# Patient Record
Sex: Male | Born: 1980 | Race: White | Hispanic: No | Smoking: Former smoker
Health system: Southern US, Community
[De-identification: ages and names within clinical notes are randomized; demographics above are authoritative.]

## PROBLEM LIST (undated history)

## (undated) DIAGNOSIS — I10 Essential (primary) hypertension: Secondary | ICD-10-CM

## (undated) DIAGNOSIS — E785 Hyperlipidemia, unspecified: Secondary | ICD-10-CM

## (undated) DIAGNOSIS — N529 Male erectile dysfunction, unspecified: Secondary | ICD-10-CM

## (undated) DIAGNOSIS — F419 Anxiety disorder, unspecified: Secondary | ICD-10-CM

## (undated) DIAGNOSIS — F32A Depression, unspecified: Secondary | ICD-10-CM

## (undated) HISTORY — DX: Hyperlipidemia, unspecified: E78.5

## (undated) HISTORY — DX: Essential (primary) hypertension: I10

## (undated) HISTORY — PX: BLEPHAROPLASTY: SUR158

---

## 2021-01-05 ENCOUNTER — Emergency Department (HOSPITAL_COMMUNITY): Payer: No Typology Code available for payment source

## 2021-01-05 ENCOUNTER — Encounter (HOSPITAL_COMMUNITY): Payer: Self-pay | Admitting: Emergency Medicine

## 2021-01-05 DIAGNOSIS — R0789 Other chest pain: Secondary | ICD-10-CM | POA: Diagnosis present

## 2021-01-05 DIAGNOSIS — Z7982 Long term (current) use of aspirin: Secondary | ICD-10-CM | POA: Diagnosis not present

## 2021-01-05 LAB — CBC
HCT: 43.7 % (ref 39.0–52.0)
Hemoglobin: 15.4 g/dL (ref 13.0–17.0)
MCH: 36.9 pg — ABNORMAL HIGH (ref 26.0–34.0)
MCHC: 35.2 g/dL (ref 30.0–36.0)
MCV: 104.8 fL — ABNORMAL HIGH (ref 80.0–100.0)
Platelets: 190 10*3/uL (ref 150–400)
RBC: 4.17 MIL/uL — ABNORMAL LOW (ref 4.22–5.81)
RDW: 11 % — ABNORMAL LOW (ref 11.5–15.5)
WBC: 8.8 10*3/uL (ref 4.0–10.5)
nRBC: 0 % (ref 0.0–0.2)

## 2021-01-05 LAB — BASIC METABOLIC PANEL
Anion gap: 10 (ref 5–15)
BUN: 10 mg/dL (ref 6–20)
CO2: 22 mmol/L (ref 22–32)
Calcium: 9.1 mg/dL (ref 8.9–10.3)
Chloride: 101 mmol/L (ref 98–111)
Creatinine, Ser: 0.71 mg/dL (ref 0.61–1.24)
GFR, Estimated: >60 mL/min (ref >60)
Glucose, Bld: 95 mg/dL (ref 70–99)
Potassium: 3.9 mmol/L (ref 3.5–5.1)
Sodium: 133 mmol/L — ABNORMAL LOW (ref 135–145)

## 2021-01-05 LAB — ETHANOL: Alcohol, Ethyl (B): <10 mg/dL (ref <10)

## 2021-01-05 LAB — TROPONIN I (HIGH SENSITIVITY): Troponin I (High Sensitivity): <2 ng/L (ref <18)

## 2021-01-05 NOTE — ED Triage Notes (Signed)
Patient reports burning in chest and tingling in bilateral arms with SOB since 1600. Reports three other similar episodes this week.

## 2021-01-05 NOTE — ED Triage Notes (Signed)
Emergency Medicine Provider Triage Evaluation Note  Devin Hendricks , a 40 y.o. male  was evaluated in triage.  Pt complains of midsternal chest pain.  Patient reports that his pain began at 1700 while working out at the pain.  Patient describes his pain is a burning.  No radiation of pain.  Patient also complains of burning sensation to his as well as numbness and tingling to his extremities.  Patient reports that he was a every day drinker for last 2 to 3 months however has not had any alcohol since Monday.  Review of Systems  Positive: Chest pain, shortness of breath, back pain, numbness and tingling to extremities, anxiety Negative: Nausea, vomiting, abdominal pain  Physical Exam  BP 140/89   Pulse (!) 106   Temp 97.8 F (36.6 C)   Resp 20   SpO2 97%  Gen:   Awake, no distress   HEENT:  Atraumatic  Resp:  Normal effort, lungs clear to auscultation bilaterally Cardiac:  Tachy at rate of 106, no murmurs rubs or gallops MSK:   Moves extremities without difficulty Neuro:  Speech clear   Medical Decision Making  Medically screening exam initiated at 10:09 PM.  Appropriate orders placed.  Devin Hendricks was informed that the remainder of the evaluation will be completed by another provider, this initial triage assessment does not replace that evaluation, and the importance of remaining in the ED until their evaluation is complete.  Clinical Impression   The patient appears stable so that the remainder of the work up may be completed by another provider.     Haskel Schroeder, [place] 01/05/21 2211

## 2021-01-06 ENCOUNTER — Emergency Department (HOSPITAL_COMMUNITY)
Admission: EM | Admit: 2021-01-06 | Discharge: 2021-01-06 | Disposition: A | Discharge status: Home / Self Care | Payer: No Typology Code available for payment source | Attending: Emergency Medicine | Admitting: Emergency Medicine

## 2021-01-06 DIAGNOSIS — R079 Chest pain, unspecified: Secondary | ICD-10-CM

## 2021-01-06 HISTORY — DX: Anxiety disorder, unspecified: F41.9

## 2021-01-06 HISTORY — DX: Depression, unspecified: F32.A

## 2021-01-06 HISTORY — DX: Male erectile dysfunction, unspecified: N52.9

## 2021-01-06 LAB — TROPONIN I (HIGH SENSITIVITY): Troponin I (High Sensitivity): <2 ng/L (ref <18)

## 2021-01-06 MED ORDER — LIDOCAINE VISCOUS HCL 2 % MT SOLN
15.0000 mL | Freq: Once | OROMUCOSAL | Status: AC
  Administered 2021-01-06: 15 mL via ORAL
  Filled 2021-01-06: qty 15

## 2021-01-06 MED ORDER — ALUM & MAG HYDROXIDE-SIMETH 200-200-20 MG/5ML PO SUSP
30.0000 mL | Freq: Once | ORAL | Status: AC
  Administered 2021-01-06: 30 mL via ORAL
  Filled 2021-01-06: qty 30

## 2021-01-06 NOTE — ED Provider Notes (Signed)
[facility]-EMERGENCY DEPT Provider Note   CSN: [id number] Arrival date & time: 01/05/21  2152     History Chief Complaint  Patient presents with  . Chest Pain    Devin Hendricks is a 40 y.o. male.  40 year old male presents to the emergency department for evaluation of midsternal, burning chest pain.  Symptoms began while he was working out at [employer]; however, he has had recurrence of similar pain over the past week 4 or 5 other times.  It has not been brought on by exertion before.  Pain typically subsides spontaneously in 4 to 5 hours.  He does feel increasingly anxious when he has the pain.  Has occasionally experienced associated paresthesias in his hands and bilateral feet.  Denies taking any medications for his symptoms.  Does endorse history of heavy drinking.  Prior to 1 week ago he was drinking a 12 pack of beer a day.  Denies tremors or seizures from alcohol withdrawal.  No nausea, vomiting, diaphoresis, abdominal pain, fevers.  The history is provided by the patient. No language interpreter was used.  Chest Pain      Past Medical History:  Diagnosis Date  . Anxiety   . Depression   . Erectile dysfunction     There are no problems to display for this patient.   History reviewed. No pertinent surgical history.     No family history on file.     Home Medications Prior to Admission medications   Medication Sig Start Date End Date Taking? Authorizing Provider  aspirin 325 MG tablet Take 325 mg by mouth daily as needed for mild pain, moderate pain or headache.   Yes [provider]  buPROPion (WELLBUTRIN XL) 300 MG 24 hr tablet Take 300 mg by mouth daily. 04/05/19  Yes [provider]  tadalafil (CIALIS) 5 MG tablet Take 5 mg by mouth daily. For BPH 04/05/19  Yes [provider]  traZODone (DESYREL) 150 MG tablet Take 150 mg by mouth at bedtime. 04/05/19  Yes [provider]    Allergies    Patient has  no known allergies.  Review of Systems   Review of Systems  Cardiovascular: Positive for chest pain.  Ten systems reviewed and are negative for acute change, except as noted in the HPI.    Physical Exam Updated Vital Signs BP 125/80 (BP Location: Right Arm)   Pulse 78   Temp 98 F (36.7 C) (Oral)   Resp 16   Ht 5\' 10"  (1.778 m)   Wt 73 kg   SpO2 99%   BMI 23.10 kg/m   Physical Exam Vitals and nursing note reviewed.  Constitutional:      General: He is not in acute distress.    Appearance: He is well-developed. He is not diaphoretic.     Comments: Nontoxic appearing and in NAD  HENT:     Head: Normocephalic and atraumatic.  Eyes:     General: No scleral icterus.    Conjunctiva/sclera: Conjunctivae normal.  Pulmonary:     Effort: Pulmonary effort is normal. No respiratory distress.     Comments: Respirations even and unlabored Musculoskeletal:        General: Normal range of motion.     Cervical back: Normal range of motion.  Skin:    General: Skin is warm and dry.     Coloration: Skin is not pale.     Findings: No erythema or rash.  Neurological:     Mental Status:  He is alert and oriented to person, place, and time.  Psychiatric:        Behavior: Behavior normal.     ED Results / Procedures / Treatments   Labs (all labs ordered are listed, but only abnormal results are displayed) Labs Reviewed  BASIC METABOLIC PANEL - Abnormal; Notable for the following components:      Result Value   Sodium 133 (*)    All other components within normal limits  CBC - Abnormal; Notable for the following components:   RBC 4.17 (*)    MCV 104.8 (*)    MCH 36.9 (*)    RDW 11.0 (*)    All other components within normal limits  ETHANOL  TROPONIN I (HIGH SENSITIVITY)  TROPONIN I (HIGH SENSITIVITY)    EKG None  Radiology DG Chest 2 View  Result Date: 01/05/2021 CLINICAL DATA:  Chest pain EXAM: CHEST - 2 VIEW COMPARISON:  None. FINDINGS: The heart size and mediastinal  contours are within normal limits. Both lungs are clear. The visualized skeletal structures are unremarkable. IMPRESSION: No active cardiopulmonary disease. Electronically Signed   By: Deatra Robinson M.D.   On: 01/05/2021 22:28    Procedures Procedures   Medications Ordered in ED Medications  alum & mag hydroxide-simeth (MAALOX/MYLANTA) 200-200-20 MG/5ML suspension 30 mL (30 mLs Oral Given 01/06/21 0211)    And  lidocaine (XYLOCAINE) 2 % viscous mouth solution 15 mL (15 mLs Oral Given 01/06/21 0211)    ED Course  I have reviewed the triage vital signs and the nursing notes.  Pertinent labs & imaging results that were available during my care of the patient were reviewed by me and considered in my medical decision making (see chart for details).    MDM Rules/Calculators/A&P                          40 year old male presents to the emergency department for evaluation of chest pain.  He has had approximately 4 episodes of similar burning midsternal discomfort in the past week.  Unknown provoking or alleviating factors, but usually subsides spontaneously in 4 to 5 hours.  His symptoms are atypical for ACS.  EKG today is nonischemic.  He has a negative troponin x2.  Chest x-ray without acute cardiopulmonary abnormality.  Specifically, no cardiomegaly, pneumothorax, pleural effusion, pneumonia.  He does have a longstanding history of alcohol use and abuse.  Discussed the possibility of underlying gastritis, PUD; however, he had little change in his residual pain with a GI cocktail given today.  Uses alcohol as a way of self-medicating and managing his anxiety.  His paresthesias do sound fairly consistent with symptoms of a panic attack; anxiety may also be provoking his chest pain.    Doubt emergent etiology.  Feel the patient is stable for discharge and outpatient cardiology follow-up.  Will provide referral.  He has been encouraged to refrain from alcohol consumption.  Return precautions discussed  and provided. Patient discharged in stable condition with no unaddressed concerns.   Final Clinical Impression(s) / ED Diagnoses Final diagnoses:  Nonspecific chest pain    Rx / DC Orders ED Discharge Orders    None       Antony Madura, PA-C 01/06/21 0109    Marily Memos, MD 01/06/21 [phone]

## 2021-01-06 NOTE — Discharge Instructions (Signed)
Your evaluation in the emergency department today was reassuring.  We recommend follow-up with cardiology for further evaluation of your chest pain symptoms as well as a primary care doctor.  Try to limit your alcohol consumption.  Return for new or concerning symptoms.

## 2021-02-07 ENCOUNTER — Ambulatory Visit: Payer: No Typology Code available for payment source | Admitting: Interventional Cardiology

## 2021-02-14 ENCOUNTER — Ambulatory Visit: Payer: Self-pay | Admitting: Interventional Cardiology

## 2021-03-13 ENCOUNTER — Emergency Department (HOSPITAL_COMMUNITY)
Admission: EM | Admit: 2021-03-13 | Discharge: 2021-03-14 | Disposition: A | Discharge status: Home / Self Care | Payer: PRIVATE HEALTH INSURANCE | Attending: Emergency Medicine | Admitting: Emergency Medicine

## 2021-03-13 ENCOUNTER — Emergency Department (HOSPITAL_COMMUNITY): Payer: PRIVATE HEALTH INSURANCE

## 2021-03-13 DIAGNOSIS — R200 Anesthesia of skin: Secondary | ICD-10-CM | POA: Diagnosis not present

## 2021-03-13 DIAGNOSIS — R079 Chest pain, unspecified: Secondary | ICD-10-CM | POA: Diagnosis not present

## 2021-03-13 DIAGNOSIS — H538 Other visual disturbances: Secondary | ICD-10-CM | POA: Diagnosis not present

## 2021-03-13 DIAGNOSIS — R0602 Shortness of breath: Secondary | ICD-10-CM | POA: Diagnosis not present

## 2021-03-13 DIAGNOSIS — Z5321 Procedure and treatment not carried out due to patient leaving prior to being seen by health care provider: Secondary | ICD-10-CM | POA: Insufficient documentation

## 2021-03-13 DIAGNOSIS — M79602 Pain in left arm: Secondary | ICD-10-CM | POA: Diagnosis not present

## 2021-03-13 LAB — BASIC METABOLIC PANEL
Anion gap: 9 (ref 5–15)
BUN: 8 mg/dL (ref 6–20)
CO2: 24 mmol/L (ref 22–32)
Calcium: 9 mg/dL (ref 8.9–10.3)
Chloride: 87 mmol/L — ABNORMAL LOW (ref 98–111)
Creatinine, Ser: 0.63 mg/dL (ref 0.61–1.24)
GFR, Estimated: >60 mL/min (ref >60)
Glucose, Bld: 106 mg/dL — ABNORMAL HIGH (ref 70–99)
Potassium: 4.9 mmol/L (ref 3.5–5.1)
Sodium: 120 mmol/L — ABNORMAL LOW (ref 135–145)

## 2021-03-13 LAB — CBC
HCT: 36.8 % — ABNORMAL LOW (ref 39.0–52.0)
Hemoglobin: 13.5 g/dL (ref 13.0–17.0)
MCH: 33.6 pg (ref 26.0–34.0)
MCHC: 36.7 g/dL — ABNORMAL HIGH (ref 30.0–36.0)
MCV: 91.5 fL (ref 80.0–100.0)
Platelets: 280 10*3/uL (ref 150–400)
RBC: 4.02 MIL/uL — ABNORMAL LOW (ref 4.22–5.81)
RDW: 12.2 % (ref 11.5–15.5)
WBC: 7.3 10*3/uL (ref 4.0–10.5)
nRBC: 0 % (ref 0.0–0.2)

## 2021-03-13 LAB — TROPONIN I (HIGH SENSITIVITY)
Troponin I (High Sensitivity): <2 ng/L (ref <18)
Troponin I (High Sensitivity): <2 ng/L (ref <18)

## 2021-03-13 NOTE — ED Triage Notes (Signed)
Pt c/o worsening CP/SHOB, L arm pain, blurred vision, numbness in extremities. Denies hx, but states he "doesn't feel like his brain is getting enough oxygen." States he was seen at hospital for similar occurrence, was told to follow up w cardiologist- appt 7/14. Was seen by GP today & was told EKG abnormal & was told to come to ED.

## 2021-03-14 NOTE — ED Notes (Signed)
Pt called for vitals, no response. 

## 2021-03-17 ENCOUNTER — Encounter: Payer: Self-pay | Admitting: Cardiology

## 2021-03-17 ENCOUNTER — Ambulatory Visit: Payer: PRIVATE HEALTH INSURANCE | Admitting: Cardiology

## 2021-03-17 ENCOUNTER — Other Ambulatory Visit: Payer: Self-pay

## 2021-03-17 VITALS — BP 112/69 | HR 78 | Temp 98.0°F | Resp 16 | Ht 70.0 in | Wt 160.0 lb

## 2021-03-17 DIAGNOSIS — R072 Precordial pain: Secondary | ICD-10-CM

## 2021-03-17 DIAGNOSIS — R002 Palpitations: Secondary | ICD-10-CM | POA: Insufficient documentation

## 2021-03-17 DIAGNOSIS — Z8249 Family history of ischemic heart disease and other diseases of the circulatory system: Secondary | ICD-10-CM | POA: Insufficient documentation

## 2021-03-17 NOTE — Progress Notes (Signed)
Patient referred by Devin Rosser, PA-C for chest pain  Subjective:   Devin Hendricks, male    DOB: 03-08-1981, 40 y.o.   MRN: [id number]   Chief Complaint  Patient presents with   Chest Pain     HPI  40 y.o. Caucasian male with prior history of tobacco dependence, now with chest pain, paresthesias, palpitations.  Patient stays active at baseline, works out 30 minutes on elliptical 5 days a week.  He is self-employed.  For the last several weeks, he has had episodes of retrosternal and left-sided chest tightness lasting for several hours.  He also has paresthesias in his left arm and both legs.  He feels as if " I do not get enough blood supply and oxygen to my brain", and reports forgetfulness episodes.  Patient has had multiple recent ER/urgent care evaluations recently, all ruled out ACS.  Past Medical History:  Diagnosis Date   Anxiety    Depression    Erectile dysfunction      History reviewed. No pertinent surgical history.   Social History   Tobacco Use  Smoking Status Former   Packs/day: 1.00   Years: 20.00   Pack years: 20.00   Types: Cigarettes   Quit date: 2020   Years since quitting: 2.4  Smokeless Tobacco Never    Social History   Substance and Sexual Activity  Alcohol Use Not Currently     Family History  Problem Relation Age of Onset   Heart disease Mother    Diabetes Mother      Current Outpatient Medications on File Prior to Visit  Medication Sig Dispense Refill   aspirin 325 MG tablet Take 325 mg by mouth daily as needed for mild pain, moderate pain or headache.     sildenafil (REVATIO) 20 MG tablet Take 20 mg by mouth 3 (three) times daily.     valsartan (DIOVAN) 320 MG tablet Take 320 mg by mouth daily.     No current facility-administered medications on file prior to visit.    Cardiovascular and other pertinent studies:  EKG 03/17/2021: Sinus rhythm 76 bpm Possible old anteroseptal infarct  Chest Xray 03/13/2021: No  active cardiopulmonary disease.    Recent labs: 03/13/2021: Glucose 106, BUN/Cr 8/0.63. EGFR >60. Na/K 120/4.9.  Trop HS <2 H/H 13/36. MCV 81. Platelets 280 HbA1C N/A Lipid panel N/A TSH N/A  Results for Devin, Hendricks (MRN [id number]) as of 03/17/2021 15:14  Ref. Range 01/05/2021 22:17 03/13/2021 17:23  Sodium Latest Ref Range: 135 - 145 mmol/L 133 (L) 120 (L)    Review of Systems  Cardiovascular:  Positive for chest pain and palpitations. Negative for dyspnea on exertion, leg swelling and syncope.  Neurological:  Positive for paresthesias.  Psychiatric/Behavioral:  The patient is nervous/anxious.         Vitals:   03/17/21 1514  BP: 112/69  Pulse: 78  Resp: 16  Temp: 98 F (36.7 C)  SpO2: 100%     Body mass index is 22.96 kg/m. Filed Weights   03/17/21 1514  Weight: 160 lb (72.6 kg)     Objective:   Physical Exam Vitals and nursing note reviewed.  Constitutional:      General: He is not in acute distress. Neck:     Vascular: No JVD.  Cardiovascular:     Rate and Rhythm: Normal rate and regular rhythm.     Pulses: Normal pulses.     Heart sounds: Normal heart sounds. No murmur heard. Pulmonary:  Effort: Pulmonary effort is normal.     Breath sounds: Normal breath sounds. No wheezing or rales.  Musculoskeletal:     Right lower leg: No edema.     Left lower leg: No edema.        Assessment & Recommendations:    40 y.o. Caucasian male with prior history of tobacco dependence, now with chest pain, paresthesias, palpitations.  Chest pain, palpitations: Atypical chest pain.  Family history of CAD, and personal history of prior tobacco dependence. Recommend exercise nuclear stress test, echocardiogram, CT cardiac scoring and cardiac telemetry.  Rest of his symptoms are unlikely cardiac in origin.  Recommend checking with PCP.  Hyponatremia: Noticed on recent blood work.  Etiology unclear.  Differ work-up and follow-up to PCP.  Further  recommendations after above testing.     Thank you for referring the patient to [place]. Please feel free to contact with any questions.   Nigel Mormon, MD Pager: [phone] Office: [phone]

## 2021-04-09 ENCOUNTER — Ambulatory Visit: Payer: No Typology Code available for payment source

## 2021-04-09 ENCOUNTER — Other Ambulatory Visit: Payer: Self-pay

## 2021-04-09 ENCOUNTER — Ambulatory Visit
Admission: RE | Admit: 2021-04-09 | Discharge: 2021-04-09 | Disposition: A | Discharge status: Home / Self Care | Payer: PRIVATE HEALTH INSURANCE | Source: Ambulatory Visit | Attending: Cardiology | Admitting: Cardiology

## 2021-04-09 ENCOUNTER — Inpatient Hospital Stay: Payer: No Typology Code available for payment source

## 2021-04-09 DIAGNOSIS — R002 Palpitations: Secondary | ICD-10-CM

## 2021-04-09 DIAGNOSIS — R072 Precordial pain: Secondary | ICD-10-CM

## 2021-04-09 DIAGNOSIS — Z8249 Family history of ischemic heart disease and other diseases of the circulatory system: Secondary | ICD-10-CM

## 2021-04-11 ENCOUNTER — Other Ambulatory Visit: Payer: Self-pay | Admitting: Cardiology

## 2021-04-11 DIAGNOSIS — R931 Abnormal findings on diagnostic imaging of heart and coronary circulation: Secondary | ICD-10-CM

## 2021-04-11 NOTE — Progress Notes (Signed)
Spoke with patient, transferred call to front desk staff for scheduling.

## 2021-04-15 ENCOUNTER — Ambulatory Visit: Payer: Self-pay | Admitting: Cardiology

## 2021-04-15 NOTE — Progress Notes (Signed)
8/4 is okay

## 2021-04-30 LAB — BASIC METABOLIC PANEL
BUN/Creatinine Ratio: 10 (ref 9–20)
BUN: 6 mg/dL (ref 6–24)
CO2: 23 mmol/L (ref 20–29)
Calcium: 8.8 mg/dL (ref 8.7–10.2)
Chloride: 90 mmol/L — ABNORMAL LOW (ref 96–106)
Creatinine, Ser: 0.63 mg/dL — ABNORMAL LOW (ref 0.76–1.27)
Glucose: 81 mg/dL (ref 65–99)
Potassium: 4.8 mmol/L (ref 3.5–5.2)
Sodium: 125 mmol/L — ABNORMAL LOW (ref 134–144)
eGFR: 122 mL/min/{1.73_m2} (ref >59)

## 2021-04-30 LAB — LIPID PANEL
Chol/HDL Ratio: 2.3 ratio (ref 0.0–5.0)
Cholesterol, Total: 111 mg/dL (ref 100–199)
HDL: 49 mg/dL (ref >39)
LDL Chol Calc (NIH): 50 mg/dL (ref 0–99)
Triglycerides: 52 mg/dL (ref 0–149)
VLDL Cholesterol Cal: 12 mg/dL (ref 5–40)

## 2021-05-01 ENCOUNTER — Other Ambulatory Visit: Payer: Self-pay

## 2021-05-01 ENCOUNTER — Ambulatory Visit: Payer: No Typology Code available for payment source | Admitting: Cardiology

## 2021-05-01 ENCOUNTER — Encounter: Payer: Self-pay | Admitting: Cardiology

## 2021-05-01 VITALS — BP 104/65 | HR 75 | Temp 99.0°F | Resp 16 | Ht 70.0 in | Wt 161.0 lb

## 2021-05-01 DIAGNOSIS — I4729 Other ventricular tachycardia: Secondary | ICD-10-CM | POA: Insufficient documentation

## 2021-05-01 DIAGNOSIS — I2584 Coronary atherosclerosis due to calcified coronary lesion: Secondary | ICD-10-CM | POA: Insufficient documentation

## 2021-05-01 DIAGNOSIS — I471 Supraventricular tachycardia: Secondary | ICD-10-CM | POA: Insufficient documentation

## 2021-05-01 DIAGNOSIS — I472 Ventricular tachycardia: Secondary | ICD-10-CM

## 2021-05-01 DIAGNOSIS — R931 Abnormal findings on diagnostic imaging of heart and coronary circulation: Secondary | ICD-10-CM | POA: Insufficient documentation

## 2021-05-01 DIAGNOSIS — I10 Essential (primary) hypertension: Secondary | ICD-10-CM | POA: Insufficient documentation

## 2021-05-01 MED ORDER — ATORVASTATIN CALCIUM 20 MG PO TABS
20.0000 mg | ORAL_TABLET | Freq: Every day | ORAL | 3 refills | Status: DC
Start: 2021-05-01 — End: 2022-03-06

## 2021-05-01 MED ORDER — LISINOPRIL 20 MG PO TABS: 20.0000 mg | ORAL_TABLET | Freq: Every day | ORAL | 3 refills | Status: DC

## 2021-05-01 MED ORDER — DILTIAZEM HCL ER COATED BEADS 120 MG PO CP24: 120.0000 mg | ORAL_CAPSULE | Freq: Every day | ORAL | 3 refills | Status: DC

## 2021-05-01 MED ORDER — ASPIRIN EC 81 MG PO TBEC: 81.0000 mg | DELAYED_RELEASE_TABLET | Freq: Every day | ORAL | 3 refills | Status: AC

## 2021-05-01 NOTE — Progress Notes (Signed)
Patient referred by Shanon Rosser, PA-C for chest pain  Subjective:   Devin Hendricks, male    DOB: 1981/04/24, 40 y.o.   MRN: [id number]   No chief complaint on file.    HPI  40 y.o. Caucasian male with prior history of tobacco dependence, now with chest pain, paresthesias, palpitations.  Patient continues to have episodes of palpitations.  He is very concerned about his CT scan results.  He is also concerned about his potassium levels.  I discussed the results with him, details below.  Initial consultation HPI 02/2021: Patient stays active at baseline, works out 30 minutes on elliptical 5 days a week.  He is self-employed.  For the last several weeks, he has had episodes of retrosternal and left-sided chest tightness lasting for several hours.  He also has paresthesias in his left arm and both legs.  He feels as if " I do not get enough blood supply and oxygen to my brain", and reports forgetfulness episodes.  Patient has had multiple recent ER/urgent care evaluations recently, all ruled out ACS.     Current Outpatient Medications on File Prior to Visit  Medication Sig Dispense Refill   aspirin 325 MG tablet Take 325 mg by mouth daily as needed for mild pain, moderate pain or headache.     sildenafil (REVATIO) 20 MG tablet Take 20 mg by mouth 3 (three) times daily.     valsartan (DIOVAN) 320 MG tablet Take 320 mg by mouth daily.     No current facility-administered medications on file prior to visit.    Cardiovascular and other pertinent studies:  Mobile cardiac telemetry 13 days 04/09/2021 - 04/23/2021: Dominant rhythm: Sinus. HR 44-157 bpm. Avg HR 75 bpm, in sinus rhythm. 17 episodes of SVT, fastest at 160 bpm for 4 beats, longest for 11 beats at 115 bpm. <1% isolated SVE, couplet/triplets. 1 episode of VT, at 193 bpm for 14 beats <1% isolated VE, no couplet/triplets. No atrial fibrillation/atrial flutter/VT/high grade AV block, sinus pause >3sec noted. 15 patient  triggered events, most correlated with SVE/SVT..   CT cardiac scoring 04/09/2021: LM: 0 LAD: 216 LCx: 5.4 RCA: 125   Total Agatston Score: 346 MESA database percentile: 99  Exercise nuclear stress test 04/09/2021: Normal ECG stress. The patient exercised for 10 minutes and 0 seconds of a Bruce protocol, achieving approximately 11.78 METs.  Normal BP response. No arrhythmias. Stress terminated due to THR achieved (92% of MPHR). Myocardial perfusion is normal. Overall LV systolic function is normal without regional wall motion abnormalities. Stress LV EF: 69%. No previous exam available for comparison. Low risk.  Echocardiogram 04/09/2021:  Left ventricle cavity is normal in size. Mild concentric hypertrophy of  the left ventricle. Normal global wall motion. Normal LV systolic function  with EF 61%. Normal diastolic filling pattern.  Left atrial cavity is mildly dilated.  Mild (Grade I) mitral regurgitation.  No evidence of pulmonary hypertension.  EKG 03/17/2021: Sinus rhythm 76 bpm Possible old anteroseptal infarct  Chest Xray 03/13/2021: No active cardiopulmonary disease.    Recent labs: Lipid panel 04/29/2021: Chol 111, TG 52, HDL 49, LDL 50  03/13/2021: Glucose 106, BUN/Cr 8/0.63. EGFR >60. Na/K 120/4.9.  Trop HS <2 H/H 13/36. MCV 81. Platelets 280 HbA1C N/A TSH N/A  Results for JAMS, TRICKETT (MRN [id number]) as of 03/17/2021 15:14  Ref. Range 01/05/2021 22:17 03/13/2021 17:23  Sodium Latest Ref Range: 135 - 145 mmol/L 133 (L) 120 (L)    Review of Systems  Cardiovascular:  Positive for chest pain and palpitations. Negative for dyspnea on exertion, leg swelling and syncope.  Neurological:  Positive for paresthesias.  Psychiatric/Behavioral:  The patient is nervous/anxious.         Vitals:   05/01/21 1342  BP: 104/65  Pulse: 75  Resp: 16  Temp: 99 F (37.2 C)  SpO2: 98%     Body mass index is 23.1 kg/m. Filed Weights   05/01/21 1342  Weight: 161 lb  (73 kg)     Objective:   Physical Exam Vitals and nursing note reviewed.  Constitutional:      General: He is not in acute distress. Neck:     Vascular: No JVD.  Cardiovascular:     Rate and Rhythm: Normal rate and regular rhythm.     Pulses: Normal pulses.     Heart sounds: Normal heart sounds. No murmur heard. Pulmonary:     Effort: Pulmonary effort is normal.     Breath sounds: Normal breath sounds. No wheezing or rales.  Musculoskeletal:     Right lower leg: No edema.     Left lower leg: No edema.        Assessment & Recommendations:    40 y.o. 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 Recommend diltiazem 120 mg daily.  He would like to avoid beta-blocker due to possibility of sexual dysfunction a side effect.  Elevated coronary calcium score: Total score of  346 (99th percentile) all in non-left main arteries. Recommend aggressive risk factor modification. Lipid panel very favorable, in spite of high coronary calcium score Check lipoprotein a, apolipoprotein B I will start him on atorvastatin 20 mg daily, and repeat lipid panel and lipoprotein a levels in 3 months again.  If lipoprotein a levels are increasing, will strongly consider starting PCSK9 inhibitor Repatha.  Hyponatremia: I recommend hyponatremia is related to valsartan.  Stop valsartan.  Instead, started lisinopril 20 mg daily.  I expect potassium also to be lower with this change.  Will repeat BMP in 1 week.  F/u in 3 months  Time spent: 40-minute   Nigel Mormon, MD Pager: [phone] Office: [phone]

## 2021-08-01 ENCOUNTER — Ambulatory Visit: Payer: No Typology Code available for payment source | Admitting: Cardiology

## 2021-11-19 ENCOUNTER — Other Ambulatory Visit: Payer: Self-pay

## 2021-11-19 ENCOUNTER — Encounter: Payer: Self-pay | Admitting: Family Medicine

## 2021-11-19 ENCOUNTER — Ambulatory Visit (INDEPENDENT_AMBULATORY_CARE_PROVIDER_SITE_OTHER): Payer: 59 | Admitting: Family Medicine

## 2021-11-19 VITALS — BP 110/62 | HR 86 | Temp 98.1°F | Ht 69.0 in | Wt 176.2 lb

## 2021-11-19 DIAGNOSIS — Z0000 Encounter for general adult medical examination without abnormal findings: Secondary | ICD-10-CM | POA: Diagnosis not present

## 2021-11-19 DIAGNOSIS — I10 Essential (primary) hypertension: Secondary | ICD-10-CM

## 2021-11-19 DIAGNOSIS — R931 Abnormal findings on diagnostic imaging of heart and coronary circulation: Secondary | ICD-10-CM

## 2021-11-19 MED ORDER — TADALAFIL 5 MG PO TABS: 5.0000 mg | ORAL_TABLET | Freq: Every day | ORAL | 3 refills | Status: DC | PRN

## 2021-11-19 MED ORDER — EMTRICITABINE-TENOFOVIR DF 200-300 MG PO TABS: 1.0000 | ORAL_TABLET | Freq: Every day | ORAL | 1 refills | Status: DC

## 2021-11-19 NOTE — Progress Notes (Signed)
Phone: [phone]   Subjective:  Patient 41 y.o. male presenting for annual physical.  Chief Complaint  Patient presents with   Establish Care    Need general wellness exam, check lipids, need refill and get back on prep    Cpx-Tdap few yrs ago.  CT score elevated.-exercising. Taking statin SVT-taking diltiazem-bp's 110's/60's. P80-85. No symptoms BPH-on tadalifil-works well.  SAD-wellbutrin will get off for summer.  Trazodone for sleep PrEP-was doing on demand. But needing daily again. Discovy  See problem oriented charting- ROS- full  review of systems was completed and negative  except for: ROS: Gen: no fever, chills  Skin: no rash, itching ENT: no ear pain, ear drainage, nasal congestion, rhinorrhea, sinus pressure, sore throat Eyes: no blurry vision, double vision Resp: no cough, wheeze,SOB Breast: no breast tenderness, no nipple discharge, no breast masses CV: no CP, palpitations, LE edema,  GI: no heartburn, n/v/d/c, abd pain GU: no dysuria, urgency, frequency, hematuria MSK: no joint pain, myalgias, back pain Neuro: no dizziness, headache, weakness, vertigo Psych: no depression, anxiety, insomnia, SI   The following were reviewed and entered/updated in epic: Past Medical History:  Diagnosis Date   Anxiety    Depression    Erectile dysfunction    Hyperlipidemia    Hypertension    Patient Active Problem List   Diagnosis Date Noted   Elevated coronary artery calcium score 05/01/2021   PSVT (paroxysmal supraventricular tachycardia) ([place]) 05/01/2021   NSVT (nonsustained ventricular tachycardia) 05/01/2021   Essential hypertension 05/01/2021   Precordial pain 03/17/2021   Family history of early CAD 03/17/2021   Palpitations 03/17/2021   Past Surgical History:  Procedure Laterality Date   BLEPHAROPLASTY      Family History  Problem Relation Age of Onset   Cancer Mother 3       lymphoma   Hypertension Mother    Hyperlipidemia Mother    Heart  disease Mother    Early death Mother    Depression Mother    Diabetes Mother    Heart attack Mother     Medications- reviewed and updated Current Outpatient Medications  Medication Sig Dispense Refill   aspirin EC 81 MG tablet Take 1 tablet (81 mg total) by mouth daily. Swallow whole. 90 tablet 3   atorvastatin (LIPITOR) 20 MG tablet Take 1 tablet (20 mg total) by mouth daily. 90 tablet 3   buPROPion (WELLBUTRIN XL) 300 MG 24 hr tablet SMARTSIG:1 Tablet(s) By Mouth Every Evening     diltiazem (CARDIZEM CD) 120 MG 24 hr capsule Take 1 capsule (120 mg total) by mouth daily. 90 capsule 3   emtricitabine-tenofovir (TRUVADA) 200-300 MG tablet Take 1 tablet by mouth daily. 90 tablet 1   lisinopril (ZESTRIL) 30 MG tablet Take 30 mg by mouth 2 (two) times daily.     traZODone (DESYREL) 150 MG tablet SMARTSIG:1 Tablet(s) By Mouth Every Evening     tadalafil (CIALIS) 5 MG tablet Take 1 tablet (5 mg total) by mouth daily as needed for erectile dysfunction. 90 tablet 3   No current facility-administered medications for this visit.    Allergies-reviewed and updated No Known Allergies  Social History   Social History Narrative   Children 1 adopted 1 natural      Self-media production   Objective  Objective:  BP 110/62    Pulse 86    Temp 98.1 F (36.7 C) (Temporal)    Ht 5\' 9"  (1.753 m)    Wt 176 lb 4 oz (79.9 kg)  SpO2 97%    BMI 26.03 kg/m  Physical Exam  Gen: WDWN NAD HEENT: NCAT, conjunctiva not injected, sclera nonicteric NECK:  supple, no thyromegaly, no nodes, no carotid bruits CARDIAC: RRR, S1S2+, no murmur. DP 2+B LUNGS: CTAB. No wheezes ABDOMEN:  BS+, soft, NTND, No HSM, no masses EXT:  no edema MSK: no gross abnormalities. MS 5/5 all 4 NEURO: A&O x3.  CN II-XII intact.  PSYCH: normal mood. Good eye contact    Assessment and Plan   Health Maintenance counseling: 1. Anticipatory guidance: Patient counseled regarding regular dental exams q6 months, eye exams yearly,  avoiding smoking and second hand smoke, limiting alcohol to 2 beverages per day.   2. Risk factor reduction:  Advised patient of need for regular exercise and diet rich in fruits and vegetables to reduce risk of heart attack and stroke. Exercise- does.   Wt Readings from Last 3 Encounters:  11/19/21 176 lb 4 oz (79.9 kg)  05/01/21 161 lb (73 kg)  03/17/21 160 lb (72.6 kg)   3. Immunizations/screenings/ancillary studies Immunization History  Administered Date(s) Administered   Influenza,inj,quad, With Preservative 08/08/2018   Influenza-Unspecified 05/29/2021   PFIZER(Purple Top)SARS-COV-2 Vaccination 12/07/2019, 12/28/2019, 08/28/2020, 07/21/2021   Pneumococcal Polysaccharide-23 03/04/2017   Vaccinia,smallpox Monkeypox Vaccine Live,pf 06/15/2021, 07/21/2021   Health Maintenance Due  Topic Date Due   HIV Screening  Never done   Hepatitis C Screening  Never done   TETANUS/TDAP  Never done    4. Prostate cancer screening >55yo - risk factors? No results found for: PSA  5. Colon cancer screening:n/z 6. Skin cancer screening- Iadvised regular sunscreen use. Denies worrisome, changing, or new skin lesions.  7. Smoking associated screening (lung cancer screening, AAA screen 65-75, UA)- non smoker-8. STD screening - hold  Problem List Items Addressed This Visit       Cardiovascular and Mediastinum   Elevated coronary artery calcium score   Relevant Medications   lisinopril (ZESTRIL) 30 MG tablet   tadalafil (CIALIS) 5 MG tablet   Other Relevant Orders   Lipoprotein A (LPA)   Essential hypertension   Relevant Medications   lisinopril (ZESTRIL) 30 MG tablet   tadalafil (CIALIS) 5 MG tablet   Other Visit Diagnoses     Well adult exam    -  Primary   Relevant Orders   Lipid panel   Comprehensive metabolic panel   HIV Antibody (routine testing w rflx)   Lipoprotein A (LPA)      Wellness-[facility] guidance.  Check labs Elevated Calcium score-on atorvastatin.  Check labs.  Pt req  Lpa as well.  Cont TLC HTN/PSVT-stable on meds. Cont PrEP-will do Truvada but pt will check ins coverage for others.  BPH-cont tadalifil.  Recommended follow up: 3Return in about 3 months (around 02/16/2022) for f/u htn, etc. No future appointments.   Lab/Order associations:+ fasting   ICD-10-CM   1. Well adult exam  Z00.00 Lipid panel    Comprehensive metabolic panel    HIV Antibody (routine testing w rflx)    Lipoprotein A (LPA)    CANCELED: Hemoglobin A1c    CANCELED: TSH    CANCELED: CBC with Differential/Platelet    CANCELED: Hepatitis C Antibody    2. Elevated coronary artery calcium score  R93.1 Lipoprotein A (LPA)    3. Essential hypertension  I10       Meds ordered this encounter  Medications   tadalafil (CIALIS) 5 MG tablet    Sig: Take 1 tablet (5 mg total) by mouth  daily as needed for erectile dysfunction.    Dispense:  90 tablet    Refill:  3   emtricitabine-tenofovir (TRUVADA) 200-300 MG tablet    Sig: Take 1 tablet by mouth daily.    Dispense:  90 tablet    Refill:  1     Wellington Hampshire, MD

## 2021-11-19 NOTE — Patient Instructions (Signed)
Welcome to [facility] at [place]! It was a pleasure meeting you today.  As discussed, Please schedule a 3 month follow up visit today.  PLEASE NOTE:  If you had any LAB tests please let us know if you have not heard back within a few days. You may see your results on MyChart before we have a chance to review them but we will give you a call once they are reviewed by us. If we ordered any REFERRALS today, please let us know if you have not heard from their office within the next week.  Let us know through MyChart if you are needing REFILLS, or have your pharmacy send us the request. You can also use MyChart to communicate with me or any office staff.  Please try these tips to maintain a healthy lifestyle:  Eat most of your calories during the day when you are active. Eliminate processed foods including packaged sweets (pies, cakes, cookies), reduce intake of potatoes, white bread, white pasta, and white rice. Look for whole grain options, oat flour or almond flour.  Each meal should contain half fruits/vegetables, one quarter protein, and one quarter carbs (no bigger than a computer mouse).  Cut down on sweet beverages. This includes juice, soda, and sweet tea. Also watch fruit intake, though this is a healthier sweet option, it still contains natural sugar! Limit to 3 servings daily.  Drink at least 1 glass of water with each meal and aim for at least 8 glasses per day  Exercise at least 150 minutes every week.   

## 2021-11-20 LAB — COMPREHENSIVE METABOLIC PANEL
ALT: 12 U/L (ref 0–53)
AST: 13 U/L (ref 0–37)
Albumin: 4.6 g/dL (ref 3.5–5.2)
Alkaline Phosphatase: 47 U/L (ref 39–117)
BUN: 14 mg/dL (ref 6–23)
CO2: 29 mEq/L (ref 19–32)
Calcium: 9.2 mg/dL (ref 8.4–10.5)
Chloride: 99 mEq/L (ref 96–112)
Creatinine, Ser: 0.82 mg/dL (ref 0.40–1.50)
GFR: 108.23 mL/min (ref >60.00)
Glucose, Bld: 81 mg/dL (ref 70–99)
Potassium: 4.8 mEq/L (ref 3.5–5.1)
Sodium: 135 mEq/L (ref 135–145)
Total Bilirubin: 0.5 mg/dL (ref 0.2–1.2)
Total Protein: 6.8 g/dL (ref 6.0–8.3)

## 2021-11-20 LAB — LIPID PANEL
Cholesterol: 104 mg/dL (ref 0–200)
HDL: 42.3 mg/dL (ref >39.00)
LDL Cholesterol: 49 mg/dL (ref 0–99)
NonHDL: 61.85
Total CHOL/HDL Ratio: 2
Triglycerides: 63 mg/dL (ref 0.0–149.0)
VLDL: 12.6 mg/dL (ref 0.0–40.0)

## 2021-11-21 ENCOUNTER — Encounter: Payer: Self-pay | Admitting: Family Medicine

## 2021-11-24 LAB — LIPOPROTEIN A (LPA): Lipoprotein (a): 11 nmol/L (ref <75)

## 2021-11-24 LAB — HIV ANTIBODY (ROUTINE TESTING W REFLEX): HIV 1&2 Ab, 4th Generation: NONREACTIVE

## 2021-11-26 ENCOUNTER — Ambulatory Visit: Payer: No Typology Code available for payment source | Admitting: Cardiology

## 2022-02-02 ENCOUNTER — Telehealth: Payer: Self-pay

## 2022-02-02 NOTE — Telephone Encounter (Signed)
..   Encourage patient to contact the pharmacy for refills or they can request refills through [facility]  LAST APPOINTMENT DATE: 11/19/21  NEXT APPOINTMENT DATE:  MEDICATION: Trazodone and lisinopril  Is the patient out of medication?   PHARMACY: [facility] on [address]  Let patient know to contact pharmacy at the end of the day to make sure medication is ready.  Please notify patient to allow 48-72 hours to process  CLINICAL FILLS OUT ALL BELOW:   LAST REFILL:  QTY:  REFILL DATE:    OTHER COMMENTS:    Okay for refill?  Please advise

## 2022-02-03 ENCOUNTER — Encounter: Payer: Self-pay | Admitting: Family Medicine

## 2022-02-03 ENCOUNTER — Ambulatory Visit (INDEPENDENT_AMBULATORY_CARE_PROVIDER_SITE_OTHER): Payer: 59 | Admitting: Family Medicine

## 2022-02-03 ENCOUNTER — Other Ambulatory Visit: Payer: Self-pay

## 2022-02-03 VITALS — BP 120/78 | HR 92 | Temp 97.9°F | Ht 69.0 in | Wt 176.8 lb

## 2022-02-03 DIAGNOSIS — I10 Essential (primary) hypertension: Secondary | ICD-10-CM | POA: Diagnosis not present

## 2022-02-03 DIAGNOSIS — G4700 Insomnia, unspecified: Secondary | ICD-10-CM

## 2022-02-03 DIAGNOSIS — N485 Ulcer of penis: Secondary | ICD-10-CM | POA: Diagnosis not present

## 2022-02-03 MED ORDER — LISINOPRIL 30 MG PO TABS: 30.0000 mg | ORAL_TABLET | Freq: Two times a day (BID) | ORAL | 3 refills | Status: DC

## 2022-02-03 MED ORDER — TRAZODONE HCL 300 MG PO TABS: ORAL_TABLET | ORAL | 0 refills | Status: DC

## 2022-02-03 MED ORDER — AZITHROMYCIN 250 MG PO TABS: ORAL_TABLET | ORAL | 0 refills | Status: DC

## 2022-02-03 MED ORDER — TRAZODONE HCL 150 MG PO TABS: ORAL_TABLET | ORAL | 0 refills | Status: DC

## 2022-02-03 NOTE — Progress Notes (Signed)
? ?  Devin Hendricks is a 41 y.o. male who presents today for an office visit. ? ?Assessment/Plan:  ?New/Acute Problems: ?Penile Ulcer ?Lesion is very painful - doubt that this is syphilis.  He is additionally had RPR recently that was nonreactive.  He has not had any response to Valtrex and his HSV culture was negative-doubt this is herpes.  He has inguinal tender lymphadenopathy and possibly has chancroid.  We discussed limited diagnostic capabilities for this.  We will empirically treat with azithromycin to cover for chancroid.  He will let me know if not improving over the next several days.  If symptoms continue to persist would consider possible empiric treatment for monkeypox versus granuloma inguinale vs looking for non-STI causes.  We discussed reasons to return to care. ? ?Chronic Problems Addressed Today: ?Insomnia ?He can increase his trazodone to 300 mg daily.  He will follow-up with his PCP soon. ? ?HTN ?At goal today on lisinopril 30 mg daily. ? ?  ?Subjective:  ?HPI: ? ?Patient here with painful lesion on his penis.  Started a couple of weeks ago.  He went to urgent care a few days after it started.  He was diagnosed with herpes.  He was started on Valtrex 1 g for 10 days and also started on ciprofloxacin.  He had a complete panel of STI testing done at that time including HSV culture, RPR, HIV, gonorrhea, chlamydia, trichomonas.  All of this testing was negative.  He has finished his course of Valtrex however lesion has continued to grow.  Now involves much of the area around the glans of his penis.  Very painful.  He has had some malaise and low-grade fever.  He also has had some tender lymph nodes in his groin.  He has never had anything like this in the past.  No known exposures.  He has been in a committed relationship. ? ? ?   ?  ?Objective:  ?Physical Exam: ?BP 120/78 (BP Location: Left Arm)   Pulse 92   Temp 97.9 ?F (36.6 ?C) (Temporal)   Ht 5\' 9"  (1.753 m)   Wt 176 lb 12.8 oz (80.2 kg)    SpO2 96%   BMI 26.11 kg/m?   ?Gen: No acute distress, resting comfortably ?CV: Regular rate and rhythm with no murmurs appreciated ?Pulm: Normal work of breathing, clear to auscultation bilaterally with no crackles, wheezes, or rhonchi ?GU: Ulcerated exudative ulcer on glans.  Tender to palpation.  Slight edema and erythema noted around foreskin. ?Neuro: Grossly normal, moves all extremities ?Psych: Normal affect and thought content ? ?   ? ?Algis Greenhouse. Jerline Pain, MD ?02/03/2022 3:46 PM  ?

## 2022-02-03 NOTE — Telephone Encounter (Signed)
Rx sent to pharmacy   

## 2022-02-03 NOTE — Patient Instructions (Signed)
It was very nice to see you today! ? ?I think you probably have chancroid.  We will treat you today with 1000 mg of azithromycin. ? ?Let me know if not proving over the next few days. ? ?You can increase your trazodone to 300 mg daily. ? ?Take care, ?Dr Jimmey Ralph ? ?PLEASE NOTE: ? ?If you had any lab tests please let us know if you have not heard back within a few days. You may see your results on mychart before we have a chance to review them but we will give you a call once they are reviewed by [place]. If we ordered any referrals today, please let us know if you have not heard from their office within the next week.  ? ?Please try these tips to maintain a healthy lifestyle: ? ?Eat at least 3 REAL meals and 1-2 snacks per day.  Aim for no more than 5 hours between eating.  If you eat breakfast, please do so within one hour of getting up.  ? ?Each meal should contain half fruits/vegetables, one quarter protein, and one quarter carbs (no bigger than a computer mouse) ? ?Cut down on sweet beverages. This includes juice, soda, and sweet tea.  ? ?Drink at least 1 glass of water with each meal and aim for at least 8 glasses per day ? ?Exercise at least 150 minutes every week.   ?

## 2022-02-03 NOTE — Telephone Encounter (Signed)
Ok with me. Please place any necessary orders. 

## 2022-02-12 ENCOUNTER — Ambulatory Visit (INDEPENDENT_AMBULATORY_CARE_PROVIDER_SITE_OTHER): Payer: 59 | Admitting: Family Medicine

## 2022-02-12 ENCOUNTER — Encounter: Payer: Self-pay | Admitting: Family Medicine

## 2022-02-12 ENCOUNTER — Other Ambulatory Visit (HOSPITAL_COMMUNITY)
Admission: RE | Admit: 2022-02-12 | Discharge: 2022-02-12 | Disposition: A | Discharge status: Home / Self Care | Payer: 59 | Source: Ambulatory Visit | Attending: Family Medicine | Admitting: Family Medicine

## 2022-02-12 VITALS — BP 100/60 | HR 79 | Temp 97.9°F | Ht 69.0 in | Wt 174.8 lb

## 2022-02-12 DIAGNOSIS — N485 Ulcer of penis: Secondary | ICD-10-CM | POA: Diagnosis not present

## 2022-02-12 MED ORDER — AZITHROMYCIN 250 MG PO TABS: 1000.0000 mg | ORAL_TABLET | Freq: Once | ORAL | 0 refills | Status: AC

## 2022-02-12 MED ORDER — VALACYCLOVIR HCL 1 G PO TABS: 1000.0000 mg | ORAL_TABLET | Freq: Two times a day (BID) | ORAL | 0 refills | Status: AC

## 2022-02-12 MED ORDER — AZITHROMYCIN 250 MG PO TABS: ORAL_TABLET | ORAL | 0 refills | Status: DC

## 2022-02-12 NOTE — Patient Instructions (Signed)
It was very nice to see you today!  We will give you another round of azithromycin today.  I will send in a prescription.  You can take 1 additional dose a week from today.  We will also restart Valtrex.  We will check blood work to look for herpes and syphilis.  Let me know how you are doing next week.  Take care, Dr Jerline Pain  PLEASE NOTE:  If you had any lab tests please let us know if you have not heard back within a few days. You may see your results on mychart before we have a chance to review them but we will give you a call once they are reviewed by [place]. If we ordered any referrals today, please let us know if you have not heard from their office within the next week.   Please try these tips to maintain a healthy lifestyle:  Eat at least 3 REAL meals and 1-2 snacks per day.  Aim for no more than 5 hours between eating.  If you eat breakfast, please do so within one hour of getting up.   Each meal should contain half fruits/vegetables, one quarter protein, and one quarter carbs (no bigger than a computer mouse)  Cut down on sweet beverages. This includes juice, soda, and sweet tea.   Drink at least 1 glass of water with each meal and aim for at least 8 glasses per day  Exercise at least 150 minutes every week.

## 2022-02-12 NOTE — Addendum Note (Signed)
Addended by: Dyann Kief on: 02/12/2022 03:40 PM   Modules accepted: Orders

## 2022-02-12 NOTE — Progress Notes (Addendum)
   Devin Hendricks is a 41 y.o. male who presents today for an office visit.  Assessment/Plan:  Penile Ulcer Slightly improved since last week though lesions are still present.  We still do not have a clear diagnosis.  He is concerned about scarring and we will empirically treat for multiple etiologies while we are awaiting repeat labs to come back.  It is possible that his HSV testing from a couple of weeks ago was a false negative.  We will recheck HSV today and start him on extended course of Valtrex.  Even though do not typically expect painful ulcer to be syphilis we will empirically treat given his lack of resolution and lack of other diagnoses at this point.  We will give 2,400,000 units Bicillin today.  We will also give 1000 mg of azithromycin today and repeat this dose in a week to treat any underlying chancroid or lymphogranuloma venereum.    We discussed checking for non-STI causes however he would like to defer for now.  We also discussed referral to urology or infectious disease and deferred for now until above work-up is completed.  We discussed reasons to return to care.  He will check with me next week via MyChart.    Subjective:  HPI:  Patient here for follow-up.  We saw him 9 days ago for penile ulcer.  At that time he had already been treated for Valtrex and also given a prescription for ciprofloxacin.  He had a lab test before visit in urgent care which was negative for all STIs.  We were concerned about possible chancroid and empirically treated him with a 1000 mg dose of azithromycin.  Got better for a few days but now symptoms have started to come back little bit.  Still has several cysts on the glans of his penis.  His lymph node swelling seems to be improving.  He is concerned that pain seems to be worsening over the last couple of days.       Objective:  Physical Exam: BP 100/60   Pulse 79   Temp 97.9 F (36.6 C) (Temporal)   Ht 5\' 9"  (1.753 m)   Wt 174 lb 12.8  oz (79.3 kg)   SpO2 94%   BMI 25.81 kg/m   Gen: No acute distress, resting comfortably CV: Regular rate and rhythm with no murmurs appreciated Pulm: Normal work of breathing, clear to auscultation bilaterally with no crackles, wheezes, or rhonchi GU: Several scattered ulcerated exudative ulcers on glans of penis.  Tenderness to palpation.  Slight edema and erythema around foreskin. Neuro: Grossly normal, moves all extremities Psych: Normal affect and thought content  Time Spent: 35 minutes of total time was spent on the date of the encounter performing the following actions: chart review prior to seeing the patient, obtaining history, performing a medically necessary exam, counseling on the treatment plan, placing orders, and documenting in our EHR.   ADDENDUM: Bicillin was not available for administration and patient elected to come back to have it done.       Algis Greenhouse. Jerline Pain, MD 02/12/2022 2:49 PM

## 2022-02-13 ENCOUNTER — Telehealth: Payer: Self-pay

## 2022-02-13 ENCOUNTER — Ambulatory Visit: Payer: 59 | Admitting: Family Medicine

## 2022-02-13 LAB — URINE CYTOLOGY ANCILLARY ONLY
Chlamydia: NEGATIVE
Comment: NEGATIVE
Comment: NORMAL
Neisseria Gonorrhea: NEGATIVE

## 2022-02-13 NOTE — Telephone Encounter (Signed)
Patient needing to come back to the office for Penicillin injections. LVM for pt to call office to schedule a Nurse Visit to receive injections

## 2022-02-14 LAB — RPR: RPR Ser Ql: REACTIVE — AB

## 2022-02-14 LAB — RPR TITER: RPR Titer: 1:64 {titer} — ABNORMAL HIGH

## 2022-02-14 LAB — FLUORESCENT TREPONEMAL AB(FTA)-IGG-BLD: Fluorescent Treponemal ABS: REACTIVE — AB

## 2022-02-14 LAB — HSV(HERPES SIMPLEX VRS) I + II AB-IGG
HAV 1 IGG,TYPE SPECIFIC AB: <0.9 index
HSV 2 IGG,TYPE SPECIFIC AB: <0.9 index

## 2022-02-15 ENCOUNTER — Encounter: Payer: Self-pay | Admitting: Family Medicine

## 2022-02-16 ENCOUNTER — Ambulatory Visit (INDEPENDENT_AMBULATORY_CARE_PROVIDER_SITE_OTHER): Payer: 59 | Admitting: *Deleted

## 2022-02-16 DIAGNOSIS — A539 Syphilis, unspecified: Secondary | ICD-10-CM | POA: Diagnosis not present

## 2022-02-16 MED ORDER — PENICILLIN G BENZATHINE 1200000 UNIT/2ML IM SUSY
1.2000 10*6.[IU] | PREFILLED_SYRINGE | Freq: Once | INTRAMUSCULAR | Status: AC
  Administered 2022-02-16: 1.2 10*6.[IU] via INTRAMUSCULAR

## 2022-02-16 MED ORDER — BICILLIN C-R 1200000 UNIT/2ML IM SUSP: 1.2000 10*6.[IU] | Freq: Once | INTRAMUSCULAR | 0 refills | Status: DC

## 2022-02-16 NOTE — Telephone Encounter (Signed)
Called #[phone],unable to LVM voice mail is full

## 2022-02-16 NOTE — Progress Notes (Signed)
Please inform patient of the following:  HIs syphilis test is positive. He needs to come back to get his bicillin injections ASAP. Everything else is NORMAL. He should come back in 6 months to recheck his levels.  Algis Greenhouse. Jerline Pain, MD 02/16/2022 8:04 AM

## 2022-02-16 NOTE — Telephone Encounter (Signed)
Mr. Devin Hendricks from [facility] health department wanting to speak to clinical about this patient regarding the STI: Syphilis determination.  Would like a call back.

## 2022-02-16 NOTE — Telephone Encounter (Signed)
I called Mr. Devin Hendricks at the number provided [phone], no answer and unable to leave a msg, VM full.

## 2022-02-16 NOTE — Telephone Encounter (Signed)
Spoke with patient, will came in today for inj.

## 2022-02-16 NOTE — Progress Notes (Signed)
Patient present for 2,400,000 units Bicillin treatment   Given (BICILLIN LA) 1200000 UNIT/2ML injection on Rt hip by Jerolyn Shin Given (BICILLIN LA) 1200000 UNIT/2ML injection on  Lt hip by Narda Rutherford Patient tolerated well

## 2022-02-17 ENCOUNTER — Ambulatory Visit: Payer: 59

## 2022-03-06 ENCOUNTER — Encounter: Payer: Self-pay | Admitting: Family Medicine

## 2022-03-06 ENCOUNTER — Ambulatory Visit (INDEPENDENT_AMBULATORY_CARE_PROVIDER_SITE_OTHER): Payer: 59 | Admitting: Family Medicine

## 2022-03-06 VITALS — BP 122/76 | HR 93 | Temp 99.2°F | Ht 69.0 in | Wt 175.5 lb

## 2022-03-06 DIAGNOSIS — N485 Ulcer of penis: Secondary | ICD-10-CM | POA: Diagnosis not present

## 2022-03-06 DIAGNOSIS — R931 Abnormal findings on diagnostic imaging of heart and coronary circulation: Secondary | ICD-10-CM

## 2022-03-06 MED ORDER — ATORVASTATIN CALCIUM 20 MG PO TABS: 20.0000 mg | ORAL_TABLET | Freq: Every day | ORAL | 3 refills | Status: DC

## 2022-03-06 MED ORDER — NYSTATIN 100000 UNIT/GM EX CREA: 1.0000 "application " | TOPICAL_CREAM | Freq: Two times a day (BID) | CUTANEOUS | 0 refills | Status: DC

## 2022-03-06 MED ORDER — DILT-XR 120 MG PO CP24
120.0000 mg | ORAL_CAPSULE | Freq: Every day | ORAL | 3 refills | Status: DC
Start: 2022-03-06 — End: 2022-04-07

## 2022-03-06 MED ORDER — PENICILLIN G BENZATHINE 2400000 UNIT/4ML IM SUSY
2.4000 10*6.[IU] | PREFILLED_SYRINGE | Freq: Once | INTRAMUSCULAR | Status: AC
  Administered 2022-03-06: 2400000 [IU] via INTRAMUSCULAR

## 2022-03-06 NOTE — Patient Instructions (Signed)
Referral to ID.  Worse, f/c,  ER

## 2022-03-06 NOTE — Progress Notes (Signed)
Subjective:     Patient ID: Devin Hendricks, male    DOB: 29-Jun-1981, 41 y.o.   MRN: [id number]  Chief Complaint  Patient presents with   Follow-up    STI not getting better, has had PCN shot almost 3 weeks ago and not responding to it    HPI Syphyllis-for 6 wks.  Bicillin 3 wks ago.  Edema better.  Sores still there.  No f/c now.   Health dept-did hiv-pending.   On doxy day 4.  Has been on other abx.     Health Maintenance Due  Topic Date Due   Hepatitis C Screening  Never done    Past Medical History:  Diagnosis Date   Anxiety    Depression    Erectile dysfunction    Hyperlipidemia    Hypertension     Past Surgical History:  Procedure Laterality Date   BLEPHAROPLASTY      Outpatient Medications Prior to Visit  Medication Sig Dispense Refill   aspirin EC 81 MG tablet Take 1 tablet (81 mg total) by mouth daily. Swallow whole. 90 tablet 3   buPROPion (WELLBUTRIN XL) 300 MG 24 hr tablet      doxycycline (VIBRA-TABS) 100 MG tablet Take 100 mg by mouth 2 (two) times daily.     emtricitabine-tenofovir (TRUVADA) 200-300 MG tablet Take 1 tablet by mouth daily. 90 tablet 1   lisinopril (ZESTRIL) 30 MG tablet Take 1 tablet (30 mg total) by mouth 2 (two) times daily. 60 tablet 3   tadalafil (CIALIS) 5 MG tablet Take 1 tablet (5 mg total) by mouth daily as needed for erectile dysfunction. 90 tablet 3   traZODone (DESYREL) 300 MG tablet SMARTSIG:1 Tablet(s) By Mouth Every Evening 30 tablet 0   atorvastatin (LIPITOR) 20 MG tablet Take 1 tablet (20 mg total) by mouth daily. 90 tablet 3   DILT-XR 120 MG 24 hr capsule Take 120 mg by mouth daily.     azithromycin (ZITHROMAX) 250 MG tablet Given PO today 4 each 0   diltiazem (CARDIZEM CD) 120 MG 24 hr capsule Take 1 capsule (120 mg total) by mouth daily. 90 capsule 3   No facility-administered medications prior to visit.    No Known Allergies ROS neg/noncontributory except as noted HPI/below      Objective:     BP 122/76    Pulse 93   Temp 99.2 F (37.3 C) (Temporal)   Ht 5\' 9"  (1.753 m)   Wt 175 lb 8 oz (79.6 kg)   SpO2 96%   BMI 25.92 kg/m  Wt Readings from Last 3 Encounters:  03/06/22 175 lb 8 oz (79.6 kg)  02/12/22 174 lb 12.8 oz (79.3 kg)  02/03/22 176 lb 12.8 oz (80.2 kg)    Physical Exam   Gen: WDWN NAD HEENT: NCAT, conjunctiva not injected, sclera nonicteric EXT:  no edema MSK: no gross abnormalities.  NEURO: A&O x3.  CN II-XII intact.  PSYCH: normal mood. Good eye contact  Penis-mult ulcers along the corona.  Few tiny ones as well.  Some are firmer.       Assessment & Plan:   Problem List Items Addressed This Visit       Cardiovascular and Mediastinum   Elevated coronary artery calcium score   Relevant Medications   atorvastatin (LIPITOR) 20 MG tablet   DILT-XR 120 MG 24 hr capsule   Other Visit Diagnoses     Penile ulcer    -  Primary   Relevant Medications  Penicillin G Benzathine SUSY 2,400,000 Units (Completed)   Other Relevant Orders   Ambulatory referral to Infectious Disease      Penile ulcer-multiple.  Has been on zmax,benzathine pcn, cipro, valtrex.  Still w/lesions.  Felt some better after pcn.  Currently on doxy-continue.  Requesting pcn again(symphllis was +).  Will do.  Will also try nystatin as poss some yeast as well, but w/mult ulcers, doubt.  Partner w/o symptoms.  Refer ID.  Pt wants to wait on Urol.    Hiv was neg and Health dept just did-results still in process.    Meds ordered this encounter  Medications   atorvastatin (LIPITOR) 20 MG tablet    Sig: Take 1 tablet (20 mg total) by mouth daily.    Dispense:  90 tablet    Refill:  3   DILT-XR 120 MG 24 hr capsule    Sig: Take 1 capsule (120 mg total) by mouth daily.    Dispense:  90 capsule    Refill:  3   Penicillin G Benzathine SUSY 2,400,000 Units   nystatin cream (MYCOSTATIN)    Sig: Apply 1 application  topically 2 (two) times daily.    Dispense:  30 g    Refill:  0    Angelena Sole,  MD

## 2022-03-10 ENCOUNTER — Ambulatory Visit: Payer: 59 | Admitting: Internal Medicine

## 2022-03-17 ENCOUNTER — Ambulatory Visit: Payer: 59 | Admitting: Internal Medicine

## 2022-03-17 DIAGNOSIS — A510 Primary genital syphilis: Secondary | ICD-10-CM | POA: Insufficient documentation

## 2022-04-06 ENCOUNTER — Encounter: Payer: Self-pay | Admitting: Family Medicine

## 2022-04-07 ENCOUNTER — Other Ambulatory Visit: Payer: Self-pay | Admitting: *Deleted

## 2022-04-07 DIAGNOSIS — R931 Abnormal findings on diagnostic imaging of heart and coronary circulation: Secondary | ICD-10-CM

## 2022-04-07 MED ORDER — DILT-XR 120 MG PO CP24: 120.0000 mg | ORAL_CAPSULE | Freq: Every day | ORAL | 3 refills | Status: DC

## 2022-04-07 MED ORDER — ATORVASTATIN CALCIUM 20 MG PO TABS: 20.0000 mg | ORAL_TABLET | Freq: Every day | ORAL | 3 refills | Status: DC

## 2022-04-10 ENCOUNTER — Telehealth: Payer: Self-pay | Admitting: Family Medicine

## 2022-04-10 NOTE — Telephone Encounter (Signed)
Patient returned call. While giving Triage Nurse information, Patient hung up. Triage Nurse stated she will call Patient.

## 2022-04-10 NOTE — Telephone Encounter (Signed)
After reviewing patient's appointment request due to experiencing shortness of breath lately, I called to have pt triaged. Due to no answer I left a detailed VM.

## 2022-04-13 ENCOUNTER — Telehealth: Payer: Self-pay | Admitting: Family Medicine

## 2022-04-13 ENCOUNTER — Encounter: Payer: Self-pay | Admitting: Family

## 2022-04-13 ENCOUNTER — Ambulatory Visit (INDEPENDENT_AMBULATORY_CARE_PROVIDER_SITE_OTHER): Payer: 59 | Admitting: Family

## 2022-04-13 VITALS — BP 94/58 | HR 67 | Temp 98.1°F | Ht 70.0 in | Wt 174.2 lb

## 2022-04-13 DIAGNOSIS — I10 Essential (primary) hypertension: Secondary | ICD-10-CM | POA: Diagnosis not present

## 2022-04-13 DIAGNOSIS — I471 Supraventricular tachycardia: Secondary | ICD-10-CM

## 2022-04-13 LAB — BASIC METABOLIC PANEL
BUN: 11 mg/dL (ref 6–23)
CO2: 27 mEq/L (ref 19–32)
Calcium: 9.1 mg/dL (ref 8.4–10.5)
Chloride: 99 mEq/L (ref 96–112)
Creatinine, Ser: 0.72 mg/dL (ref 0.40–1.50)
GFR: 112.25 mL/min (ref >60.00)
Glucose, Bld: 89 mg/dL (ref 70–99)
Potassium: 3.8 mEq/L (ref 3.5–5.1)
Sodium: 134 mEq/L — ABNORMAL LOW (ref 135–145)

## 2022-04-13 MED ORDER — DILTIAZEM HCL ER 60 MG PO CP12: 60.0000 mg | ORAL_CAPSULE | Freq: Every day | ORAL | 2 refills | Status: DC

## 2022-04-13 NOTE — Assessment & Plan Note (Addendum)
   chronic  NSR today  started on diltiazem 120mg  qd last fall  reports doing well until just recently and sx returned  advised to see cardiology, he wants to avoid specialist copay  will increase dose to 180mg  - just received 90d supply- will send 60mg  12h  continue to monitor sx and BP and notify PCP if BP abnormal or still having sx

## 2022-04-13 NOTE — Telephone Encounter (Signed)
FYI

## 2022-04-13 NOTE — Progress Notes (Signed)
Patient ID: Devin Hendricks, male    DOB: 1981-08-31, 41 y.o.   MRN: [id number]  Chief Complaint  Patient presents with  . Palpitations    Pt c/o palpitations , SOB and sternum area pain. Pt also c/o irregular heart beat, tachycardia at home over 100. Has been going on for a couple of weeks. Pt states he was put on medication before by a cardiologist and all went away for a year. Pt states it is a ongoing problem.     HPI: SVT symptoms:  reports SOB, chest pressure, dizziness, O2 sat at home is normal, but heart rate is high. States episodes last a few minutes to a few hours, at random times. Denies nausea, radiating CP, paresthesias.  Assessment & Plan:   Problem List Items Addressed This Visit       Cardiovascular and Mediastinum   PSVT (paroxysmal supraventricular tachycardia) (HCC) - Primary    chronic NSR today started on diltiazem 120mg  qd last fall reports doing well until just recently and sx returned advised to see cardiology, he wants to avoid specialist copay will increase dose to 180mg  - just received 90d supply- will send 60mg  12h continue to monitor sx and BP and notify PCP if BP abnormal or still having sx      Relevant Medications   diltiazem (CARDIZEM SR) 60 MG 12 hr capsule   Other Relevant Orders   Basic Metabolic Panel (BMET)   Essential hypertension    chronic lisinopril, diltiazem BP low today advised to cut lisinopril to qd from bid & continue to monitor, ok to cut to 1/2 if BP still <100/60, follow up w/PCP hx of valsartan - made him sick  checking BMP - reports hx of hyperkalemia      Relevant Medications   diltiazem (CARDIZEM SR) 60 MG 12 hr capsule   Other Relevant Orders   Basic Metabolic Panel (BMET)      Subjective:    Outpatient Medications Prior to Visit  Medication Sig Dispense Refill  . aspirin EC 81 MG tablet Take 1 tablet (81 mg total) by mouth daily. Swallow whole. 90 tablet 3  . atorvastatin (LIPITOR) 20 MG tablet Take 1  tablet (20 mg total) by mouth daily. 90 tablet 3  . DILT-XR 120 MG 24 hr capsule Take 1 capsule (120 mg total) by mouth daily. 90 capsule 3  . emtricitabine-tenofovir (TRUVADA) 200-300 MG tablet Take 1 tablet by mouth daily. 90 tablet 1  . lisinopril (ZESTRIL) 30 MG tablet Take 1 tablet (30 mg total) by mouth 2 (two) times daily. 60 tablet 3  . tadalafil (CIALIS) 5 MG tablet Take 1 tablet (5 mg total) by mouth daily as needed for erectile dysfunction. 90 tablet 3  . traZODone (DESYREL) 300 MG tablet SMARTSIG:1 Tablet(s) By Mouth Every Evening 30 tablet 0  . doxycycline (VIBRA-TABS) 100 MG tablet Take 100 mg by mouth 2 (two) times daily.    nystatin cream (MYCOSTATIN) Apply 1 application  topically 2 (two) times daily. 30 g 0  . buPROPion (WELLBUTRIN XL) 300 MG 24 hr tablet  (Patient not taking: Reported on 04/13/2022)     No facility-administered medications prior to visit.   Past Medical History:  Diagnosis Date  . Anxiety   . Depression   . Erectile dysfunction   . Hyperlipidemia   . Hypertension    Past Surgical History:  Procedure Laterality Date  . BLEPHAROPLASTY     No Known Allergies    Objective:  Physical Exam Vitals and nursing note reviewed.  Constitutional:      General: He is not in acute distress.    Appearance: Normal appearance.  HENT:     Head: Normocephalic.  Cardiovascular:     Rate and Rhythm: Normal rate and regular rhythm.  Pulmonary:     Effort: Pulmonary effort is normal.     Breath sounds: Normal breath sounds.  Musculoskeletal:        General: Normal range of motion.     Cervical back: Normal range of motion.  Skin:    General: Skin is warm and dry.  Neurological:     Mental Status: He is alert and oriented to person, place, and time.  Psychiatric:        Mood and Affect: Mood normal.   BP (!) 94/58 (BP Location: Left Arm, Patient Position: Sitting, Cuff Size: Large)   Pulse 67   Temp 98.1 F (36.7 C) (Temporal)   Ht 5\' 10"  (1.778 m)    Wt 174 lb 4 oz (79 kg)   SpO2 98%   BMI 25.00 kg/m  Wt Readings from Last 3 Encounters:  04/13/22 174 lb 4 oz (79 kg)  03/06/22 175 lb 8 oz (79.6 kg)  02/12/22 174 lb 12.8 oz (79.3 kg)       02/14/22, NP

## 2022-04-13 NOTE — Telephone Encounter (Signed)
Patient sch a mychart apt with hudnell for 7/17 at 820am    Patient Name: Devin Hendricks Gender: Male DOB: Aug 10, 1981 Age: 41 Y 19 D Return Phone Number: [phone] (Primary) Address: City/ State/ Zip: [place]  [address] Client [facility] - [facility], [facility] Provider Jacquiline Doe- MD Contact Type Call Who Is Calling Patient / Member / Family / Caregiver Call Type Triage / Clinical Relationship To Patient Self Return Phone Number [phone] (Primary) Chief Complaint BREATHING - shortness of breath or sounds breathless Reason for Call Symptomatic / Request for Health Information Initial Comment Pt has SOB. ((Linda from office is relaying pt info for triage. Caller not on the line to transfer)) Pt of Dr. Meridee Score, not listed. Translation No Nurse Assessment Nurse: Izora Ribas, RN, Melanie Date/Time (Eastern Time): 04/10/2022 4:31:49 PM Confirm and document reason for call. If symptomatic, describe symptoms. ---Caller states requesting an appt for SVT/tachycardia, on Diltiazem, intermittent SOB. Currently not having SOB. Prone to Hyperkalemia, requesting labs. Heart rate is trending higher recently (105-110) Does the patient have any new or worsening symptoms? ---Yes Will a triage be completed? ---Yes Related visit to physician within the last 2 weeks? ---Yes Does the PT have any chronic conditions? (i.e. diabetes, asthma, this includes High risk factors for pregnancy, etc.) ---Yes Is this a behavioral health or substance abuse call? ---No Guidelines Guideline Title Affirmed Question Affirmed Notes Nurse Date/Time (Eastern Time) Heart Rate and Heartbeat Questions [1] Palpitations AND [2] no improvement after using Care Advice Izora Ribas, RN, Shawna Orleans 04/10/2022 4:36:28 PM PLEASE NOTE: All timestamps contained within this report are represented as [place] Standard Time. CONFIDENTIALTY  NOTICE: This fax transmission is intended only for the addressee. It contains information that is legally privileged, confidential or otherwise protected from use or disclosure. If you are not the intended recipient, you are strictly prohibited from reviewing, disclosing, copying using or disseminating any of this information or taking any action in reliance on or regarding this information. If you have received this fax in error, please notify us immediately by telephone so that we can arrange for its return to [place]. Phone: [phone], Toll-Free: [phone], Fax: [phone] Page: 2 of 2 Call Id: [id number] Disp. Time Lamount Cohen Time) Disposition Final User 04/10/2022 4:18:54 PM Send to Urgent Ellin Goodie 04/10/2022 4:20:42 PM Attempt made - message left Izora Ribas, RN, Shawna Orleans 04/10/2022 4:38:23 PM SEE PCP WITHIN 3 DAYS Yes Izora Ribas, RN, Melanie Final Disposition 04/10/2022 4:38:23 PM SEE PCP WITHIN 3 DAYS Yes Izora Ribas, RN, Shawna Orleans Caller Disagree/Comply Comply Caller Understands Yes PreDisposition Call Doctor Care Advice Given Per Guideline SEE PCP WITHIN 3 DAYS: * You need to be seen within 2 or 3 days. * PCP VISIT: Call your doctor (or NP/PA) during regular office hours and make an appointment. A clinic or urgent care center are good places to go for care if your doctor's office is closed or you can't get an appointment. NOTE: If office will be open tomorrow, tell caller to call then, not in 3 days. HEALTH BASICS: CALL BACK IF: * You become worse CARE ADVICE given per Heart Rate and Heartbeat Questions (Adult) guideline. Comments User: Tomie China Date/Time (Eastern Time): 04/10/2022 4:18:41 PM Caller is not on the line to transfer to urgent. RN PLEASE CALL. Referrals REFERRED TO PCP OFFICE

## 2022-04-13 NOTE — Assessment & Plan Note (Addendum)
   chronic  lisinopril, diltiazem  BP low today  advised to cut lisinopril to qd from bid & continue to monitor, ok to cut to 1/2 if BP still <100/60, follow up w/PCP  hx of valsartan - made him sick   checking BMP - reports hx of hyperkalemia

## 2022-04-13 NOTE — Progress Notes (Signed)
Hi Devin Hendricks,  Your potassium looks good, just the sodium is slightly low, but not concerning. I would continue the plan we discussed with your medications and go ahead & schedule a follow up with Dr. Ruthine Dose for 2-3 weeks.

## 2022-04-14 ENCOUNTER — Encounter: Payer: Self-pay | Admitting: Family

## 2022-04-14 ENCOUNTER — Other Ambulatory Visit: Payer: Self-pay

## 2022-04-14 NOTE — Telephone Encounter (Signed)
needs to be refilled by PCP, thx!

## 2022-04-23 ENCOUNTER — Other Ambulatory Visit: Payer: Self-pay | Admitting: Family Medicine

## 2022-04-23 MED ORDER — TRAZODONE HCL 150 MG PO TABS: 300.0000 mg | ORAL_TABLET | Freq: Every day | ORAL | 1 refills | Status: DC

## 2022-04-24 ENCOUNTER — Encounter: Payer: Self-pay | Admitting: Family Medicine

## 2022-04-24 ENCOUNTER — Other Ambulatory Visit: Payer: Self-pay

## 2022-04-24 ENCOUNTER — Ambulatory Visit (INDEPENDENT_AMBULATORY_CARE_PROVIDER_SITE_OTHER): Payer: 59 | Admitting: Family Medicine

## 2022-04-24 ENCOUNTER — Other Ambulatory Visit (INDEPENDENT_AMBULATORY_CARE_PROVIDER_SITE_OTHER): Payer: 59

## 2022-04-24 VITALS — BP 106/68 | HR 95 | Temp 97.6°F | Ht 70.0 in | Wt 171.0 lb

## 2022-04-24 DIAGNOSIS — I10 Essential (primary) hypertension: Secondary | ICD-10-CM

## 2022-04-24 DIAGNOSIS — I471 Supraventricular tachycardia: Secondary | ICD-10-CM

## 2022-04-24 LAB — COMPREHENSIVE METABOLIC PANEL
ALT: 12 U/L (ref 0–53)
AST: 14 U/L (ref 0–37)
Albumin: 5 g/dL (ref 3.5–5.2)
Alkaline Phosphatase: 34 U/L — ABNORMAL LOW (ref 39–117)
BUN: 15 mg/dL (ref 6–23)
CO2: 28 mEq/L (ref 19–32)
Calcium: 9.3 mg/dL (ref 8.4–10.5)
Chloride: 98 mEq/L (ref 96–112)
Creatinine, Ser: 0.81 mg/dL (ref 0.40–1.50)
GFR: 108.31 mL/min (ref >60.00)
Glucose, Bld: 97 mg/dL (ref 70–99)
Potassium: 4.2 mEq/L (ref 3.5–5.1)
Sodium: 133 mEq/L — ABNORMAL LOW (ref 135–145)
Total Bilirubin: 0.6 mg/dL (ref 0.2–1.2)
Total Protein: 7.5 g/dL (ref 6.0–8.3)

## 2022-04-24 LAB — HEMOGLOBIN A1C: Hgb A1c MFr Bld: 4.7 % (ref 4.6–6.5)

## 2022-04-24 LAB — CBC
HCT: 40.9 % (ref 39.0–52.0)
Hemoglobin: 14.2 g/dL (ref 13.0–17.0)
MCHC: 34.7 g/dL (ref 30.0–36.0)
MCV: 91.4 fl (ref 78.0–100.0)
Platelets: 202 10*3/uL (ref 150.0–400.0)
RBC: 4.48 Mil/uL (ref 4.22–5.81)
RDW: 13 % (ref 11.5–15.5)
WBC: 5.5 10*3/uL (ref 4.0–10.5)

## 2022-04-24 LAB — TSH: TSH: 1.23 u[IU]/mL (ref 0.35–5.50)

## 2022-04-24 LAB — HIGH SENSITIVITY CRP: CRP, High Sensitivity: 0.82 mg/L (ref 0.000–5.000)

## 2022-04-24 LAB — SEDIMENTATION RATE: Sed Rate: 3 mm/hr (ref 0–15)

## 2022-04-24 MED ORDER — DILT-XR 120 MG PO CP24: 240.0000 mg | ORAL_CAPSULE | Freq: Every day | ORAL | 3 refills | Status: DC

## 2022-04-24 MED ORDER — DILT-XR 120 MG PO CP24: 120.0000 mg | ORAL_CAPSULE | Freq: Every day | ORAL | 3 refills | Status: DC

## 2022-04-24 NOTE — Patient Instructions (Signed)
It was very nice to see you today!  Please increase your diltiazem to 240 mg daily.  We will check blood work today.  We may need to have you follow back up with cardiologist if your symptoms are not improving  Please go to the emergency room if you have any severe crushing chest pain, severe shortness of breath, or if you lose consciousness.  Take care, Dr Jimmey Ralph  PLEASE NOTE:  If you had any lab tests please let us know if you have not heard back within a few days. You may see your results on mychart before we have a chance to review them but we will give you a call once they are reviewed by [place]. If we ordered any referrals today, please let us know if you have not heard from their office within the next week.   Please try these tips to maintain a healthy lifestyle:  Eat at least 3 REAL meals and 1-2 snacks per day.  Aim for no more than 5 hours between eating.  If you eat breakfast, please do so within one hour of getting up.   Each meal should contain half fruits/vegetables, one quarter protein, and one quarter carbs (no bigger than a computer mouse)  Cut down on sweet beverages. This includes juice, soda, and sweet tea.   Drink at least 1 glass of water with each meal and aim for at least 8 glasses per day  Exercise at least 150 minutes every week.

## 2022-04-24 NOTE — Assessment & Plan Note (Signed)
Stable.  He is currently on lisinopril 30 mg daily.  We will be increasing his diltiazem to 240 mg daily.  Discussed potential this may lower his blood pressure and discussed warning signs for this.

## 2022-04-24 NOTE — Assessment & Plan Note (Signed)
Follows with cardiology for this though has not seen them for the past year or so.  He feels like he has had worsening and more frequent SVT flares over the last few months.  We will check labs today including CBC, c-Met, TSH, and A1c to see if there are any obvious precipitating causes.  We will also check a CRP and sed rate per patient request.  We will increase his diltiazem to 240 mg daily.  His heart rate today is 95 and his blood pressure is 106/68.  We discussed potential for his blood pressure to drop.  We discussed warning signs for this including dizziness and syncope.  Ideally he would follow back with his cardiologist soon however he is hesitant to do this due to financial concerns.  We discussed reasons to return to care and seek emergent care.  If no obvious cause for worsening SVT symptoms are found on his lab work then he will need to follow back up with cardiology.

## 2022-04-24 NOTE — Addendum Note (Signed)
Addended by: Jobe Gibbon on: 04/24/2022 03:53 PM   Modules accepted: Orders

## 2022-04-24 NOTE — Progress Notes (Signed)
   Devin Hendricks is a 41 y.o. male who presents today for an office visit.  Assessment/Plan:  Chronic Problems Addressed Today: PSVT (paroxysmal supraventricular tachycardia) ([place]) Follows with cardiology for this though has not seen them for the past year or so.  He feels like he has had worsening and more frequent SVT flares over the last few months.  We will check labs today including CBC, c-Met, TSH, and A1c to see if there are any obvious precipitating causes.  We will also check a CRP and sed rate per patient request.  We will increase his diltiazem to 240 mg daily.  His heart rate today is 95 and his blood pressure is 106/68.  We discussed potential for his blood pressure to drop.  We discussed warning signs for this including dizziness and syncope.  Ideally he would follow back with his cardiologist soon however he is hesitant to do this due to financial concerns.  We discussed reasons to return to care and seek emergent care.  If no obvious cause for worsening SVT symptoms are found on his lab work then he will need to follow back up with cardiology.  Essential hypertension Stable.  He is currently on lisinopril 30 mg daily.  We will be increasing his diltiazem to 240 mg daily.  Discussed potential this may lower his blood pressure and discussed warning signs for this.     Subjective:  HPI:  Patient here for concern for worsening SVT. He was seen here a couple of weeks ago by a different provider for the same issue. His diltiazem was increased to $RemoveBefo'180mg'QFVRRSgOcTx$  daily. He has not noticed that this has helped with his symptoms at all. His symptoms are episodic and including elevated blood pressure, palpitations, shortness of breath.  This feels consistent with prior SVT episodes.  His symptoms have been relatively well controlled on diltiazem 120 mg daily for the past year until recently.  He has seen cardiologist in the past.  Does not have any symptoms at rest.  No obvious aggravating or  alleviating factors.  No recent changes.  He was recently diagnosed with parotitis and thinks that this inflammation could be causing his symptoms to worsen.       Objective:  Physical Exam: BP 106/68   Pulse 95   Temp 97.6 F (36.4 C) (Temporal)   Ht $R'5\' 10"'GO$  (1.778 m)   Wt 171 lb (77.6 kg)   SpO2 98%   BMI 24.54 kg/m   Gen: No acute distress, resting comfortably CV: Regular rate and rhythm with no murmurs appreciated Pulm: Normal work of breathing, clear to auscultation bilaterally with no crackles, wheezes, or rhonchi Neuro: Grossly normal, moves all extremities Psych: Normal affect and thought content  Time Spent: 40 minutes of total time was spent on the date of the encounter performing the following actions: chart review prior to seeing the patient including recent visits with cardiologist and work-up done last year, obtaining history, performing a medically necessary exam, counseling on the treatment plan, placing orders, and documenting in our EHR.        Algis Greenhouse. Jerline Pain, MD 04/24/2022 2:33 PM

## 2022-04-24 NOTE — Progress Notes (Signed)
Error

## 2022-04-27 LAB — C-PEPTIDE: C-Peptide: 2.18 ng/mL (ref 0.80–3.85)

## 2022-04-28 NOTE — Progress Notes (Signed)
Called pt, he understood his results.

## 2022-04-28 NOTE — Progress Notes (Signed)
Please inform patient of the following:  Labs are all stable.  No clear explanation for his worsening symptoms.  He needs to follow back up here or with cardiology if his symptoms are not improving with the medication changes we made at his last office visit.

## 2022-04-28 NOTE — Progress Notes (Signed)
Pt called back, pt verbalized understanding.

## 2022-04-28 NOTE — Progress Notes (Signed)
Called pt & no answer, left Vm.

## 2022-05-01 ENCOUNTER — Telehealth: Payer: Self-pay | Admitting: Family Medicine

## 2022-05-01 ENCOUNTER — Other Ambulatory Visit: Payer: Self-pay | Admitting: Family Medicine

## 2022-05-01 MED ORDER — DILTIAZEM HCL ER COATED BEADS 240 MG PO CP24: 240.0000 mg | ORAL_CAPSULE | Freq: Every day | ORAL | 1 refills | Status: DC

## 2022-05-01 NOTE — Telephone Encounter (Signed)
Pt states: -Current medication "is not at' the pharmacy -pt has been told by the pharmacy the Rx needs to be resent.   Routing to [employer] Care Teams    LAST APPOINTMENT DATE:   04/24/22  NEXT APPOINTMENT DATE: N/a   MEDICATION: DILT-XR 120 MG 24 hr capsule [[id number]]    Is the patient out of medication?  Not yet  PHARMACY: [facility] [address], [place] - [address]  [address] [place] [address]  Phone:  [phone]  Fax:  [phone]

## 2022-05-01 NOTE — Telephone Encounter (Signed)
Left detailed message for patient to return my call.  °

## 2022-06-22 ENCOUNTER — Encounter: Payer: Self-pay | Admitting: *Deleted

## 2022-07-28 ENCOUNTER — Other Ambulatory Visit: Payer: Self-pay | Admitting: Family Medicine

## 2022-08-18 ENCOUNTER — Telehealth: Payer: Self-pay | Admitting: Family Medicine

## 2022-08-18 NOTE — Telephone Encounter (Signed)
Patient requests TOC from Dr. Ruthine Dose to Dr. Jimmey Ralph.  Patient states he really likes Dr. Ruthine Dose a lot but would feel more comfortable with a male Dr.  Please advise.

## 2022-08-19 NOTE — Telephone Encounter (Signed)
LVM to schedule TOC from Dr. Ruthine Dose to Dr. Jimmey Ralph

## 2022-08-19 NOTE — Telephone Encounter (Signed)
Ok with me.  Katina Degree. Jimmey Ralph, MD 08/19/2022 11:04 AM

## 2022-08-26 ENCOUNTER — Other Ambulatory Visit: Payer: Self-pay | Admitting: Family Medicine

## 2022-08-26 NOTE — Telephone Encounter (Signed)
Patient transferring to [facility].

## 2022-09-10 ENCOUNTER — Encounter: Payer: Self-pay | Admitting: *Deleted

## 2022-09-17 ENCOUNTER — Ambulatory Visit: Payer: 59 | Admitting: Family

## 2022-09-17 ENCOUNTER — Encounter: Payer: Self-pay | Admitting: Family

## 2022-09-17 ENCOUNTER — Other Ambulatory Visit (HOSPITAL_COMMUNITY)
Admission: RE | Admit: 2022-09-17 | Discharge: 2022-09-17 | Disposition: A | Discharge status: Home / Self Care | Payer: 59 | Source: Ambulatory Visit | Attending: Family | Admitting: Family

## 2022-09-17 VITALS — BP 98/62 | HR 74 | Temp 97.0°F | Ht 70.0 in | Wt 162.0 lb

## 2022-09-17 DIAGNOSIS — Z113 Encounter for screening for infections with a predominantly sexual mode of transmission: Secondary | ICD-10-CM | POA: Insufficient documentation

## 2022-09-17 DIAGNOSIS — A539 Syphilis, unspecified: Secondary | ICD-10-CM

## 2022-09-17 DIAGNOSIS — R69 Illness, unspecified: Secondary | ICD-10-CM | POA: Diagnosis not present

## 2022-09-17 MED ORDER — PENICILLIN G BENZATHINE 1200000 UNIT/2ML IM SUSY
2.4000 10*6.[IU] | PREFILLED_SYRINGE | Freq: Once | INTRAMUSCULAR | Status: AC
  Administered 2022-09-17: 2.4 10*6.[IU] via INTRAMUSCULAR

## 2022-09-17 NOTE — Progress Notes (Signed)
Patient ID: Devin Hendricks, male    DOB: January 03, 1981, 41 y.o.   MRN: [id number]  Chief Complaint  Patient presents with   Rash    Pt c/o a bump on penis, Noticed a couple of days ago, redness and painful slightly.     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 States he received 2 Bicillin injections about 2-4 weeks apart. States he had many sores on his penis which seemed to worsen after 1st Bicllin injection, but resolved over 2-3 weeks after 2nd.  He states he has not been sexually active since first outbreak and noticed a new sore, red, slightly painful on end of his penis.       Assessment & Plan:  1. Screen for STD (sexually transmitted disease)   - Urine cytology ancillary only  2. Syphilis - pt due for recheck of RPR titer, new sore popped up 2 d ago, same as before, consulted with provider in ID who agreed with plan of rechecking RPR and administering Bicllin 2.78mil units. Pt had twice before in the summer and tolerated well. Pt referred to ID in June, but did not schedule appt. Advised pt to do so now d/t recurrence.   - penicillin g benzathine (BICILLIN LA) 1200000 UNIT/2ML injection 2.4 Million Units - RPR, Premarital with Reflex to Titer   Subjective:    Outpatient Medications Prior to Visit  Medication Sig Dispense Refill   aspirin EC 81 MG tablet Take 1 tablet (81 mg total) by mouth daily. Swallow whole. 90 tablet 3   atorvastatin (LIPITOR) 20 MG tablet Take 1 tablet (20 mg total) by mouth daily. 90 tablet 3   buPROPion (WELLBUTRIN XL) 300 MG 24 hr tablet      DILT-XR 120 MG 24 hr capsule Take 2 capsules (240 mg total) by mouth daily. 180 capsule 3   diltiazem (CARTIA XT)  240 MG 24 hr capsule Take 1 capsule (240 mg total) by mouth daily. 90 capsule 1   emtricitabine-tenofovir (TRUVADA) 200-300 MG tablet Take 1 tablet by mouth daily. 90 tablet 1   lisinopril (ZESTRIL) 30 MG tablet Take 1 tablet by mouth twice daily 60 tablet 0   tadalafil (CIALIS) 5 MG tablet Take 1 tablet (5 mg total) by mouth daily as needed for erectile dysfunction. 90 tablet 3   traZODone (DESYREL) 150 MG tablet Take 2 tablets (300 mg total) by mouth at bedtime. 180 tablet 1   No facility-administered medications prior to visit.   Past Medical History:  Diagnosis Date   Anxiety    Depression    Erectile dysfunction    Hyperlipidemia    Hypertension    Past Surgical History:  Procedure Laterality Date   BLEPHAROPLASTY     No Known Allergies    Objective:    Physical Exam Vitals and nursing note reviewed.  Constitutional:      General: He is not in acute distress.    Appearance: Normal appearance.  HENT:     Head: Normocephalic.  Cardiovascular:     Rate and Rhythm: Normal rate and regular rhythm.  Pulmonary:     Effort: Pulmonary effort is normal.  Breath sounds: Normal breath sounds.  Musculoskeletal:        General: Normal range of motion.     Cervical back: Normal range of motion.  Skin:    General: Skin is warm and dry.  Neurological:     Mental Status: He is alert and oriented to person, place, and time.  Psychiatric:        Mood and Affect: Mood normal.    BP 98/62 (BP Location: Left Arm, Patient Position: Sitting, Cuff Size: Large)   Pulse 74   Temp (!) 97 F (36.1 C) (Temporal)   Ht 5\' 10"  (1.778 m)   Wt 162 lb (73.5 kg)   SpO2 95%   BMI 23.24 kg/m  Wt Readings from Last 3 Encounters:  09/17/22 162 lb (73.5 kg)  04/24/22 171 lb (77.6 kg)  04/13/22 174 lb 4 oz (79 kg)       Jeanie Sewer, NP

## 2022-09-18 LAB — RPR, PREMARITAL WITH REFLEX TO TITER: RPR, PREMARITAL W/REFL TITER: REACTIVE — AB

## 2022-09-18 LAB — RPR TITER: RPR Titer: 1:4 {titer} — ABNORMAL HIGH

## 2022-09-19 LAB — T PALLIDUM AB: T Pallidum Abs: POSITIVE — AB

## 2022-09-19 LAB — RPR TITER: RPR Titer: 1:4 {titer} — ABNORMAL HIGH

## 2022-09-19 LAB — RPR: RPR Ser Ql: REACTIVE — AB

## 2022-09-22 LAB — URINE CYTOLOGY ANCILLARY ONLY
Chlamydia: NEGATIVE
Comment: NEGATIVE
Comment: NEGATIVE
Comment: NORMAL
Neisseria Gonorrhea: NEGATIVE
Trichomonas: NEGATIVE

## 2022-09-22 NOTE — Progress Notes (Signed)
It appears your titer is still positive for Syphilis. We have referred you to Infectious Disease office previously, and now that this has returned, it is imperative that you see them. Even if your sore improves and goes away, it could return and you need to be established with their office. I have sent a new referral, please be looking for their call.

## 2022-09-22 NOTE — Addendum Note (Signed)
Addended byDulce Sellar on: 09/22/2022 09:07 AM   Modules accepted: Orders

## 2022-10-02 ENCOUNTER — Other Ambulatory Visit: Payer: Self-pay | Admitting: Family Medicine

## 2022-10-08 ENCOUNTER — Encounter: Payer: Self-pay | Admitting: Internal Medicine

## 2022-10-08 ENCOUNTER — Other Ambulatory Visit (HOSPITAL_COMMUNITY): Payer: Self-pay

## 2022-10-08 ENCOUNTER — Ambulatory Visit: Payer: 59 | Admitting: Internal Medicine

## 2022-10-08 ENCOUNTER — Other Ambulatory Visit: Payer: Self-pay

## 2022-10-08 ENCOUNTER — Telehealth: Payer: Self-pay | Admitting: Pharmacist

## 2022-10-08 VITALS — BP 99/62 | HR 86 | Temp 98.3°F | Resp 16 | Ht 70.0 in | Wt 170.2 lb

## 2022-10-08 DIAGNOSIS — Z7252 High risk homosexual behavior: Secondary | ICD-10-CM

## 2022-10-08 DIAGNOSIS — R69 Illness, unspecified: Secondary | ICD-10-CM | POA: Diagnosis not present

## 2022-10-08 DIAGNOSIS — Z7251 High risk heterosexual behavior: Secondary | ICD-10-CM | POA: Insufficient documentation

## 2022-10-08 DIAGNOSIS — A510 Primary genital syphilis: Secondary | ICD-10-CM

## 2022-10-08 NOTE — Telephone Encounter (Signed)
Patient previously on Truvada but interested in Apretude. Will work on [place] through [employer] and follow-up with him.  Counseled that Apretude is one intramuscular injection in the gluteal muscle for each visit. Explained that the second injection is 30 days after the initial injection then every 2 months thereafter. Discussed follow up appointments moving forward. It is required to have a negative HIV test immediately prior to injection administration. A rapid HIV blood test will be drawn each visit, and patient must wait for results before getting injection, which typically takes 20-30 minutes. Will make lab appointments for each injection 30 minutes prior to injection appointment. Screened patient for acute HIV symptoms such as fatigue, muscle aches, rash, sore throat, lymphadenopathy, headache, night sweats, nausea/vomiting/diarrhea, and fever. Patient denies any symptoms.   Explained that showing up to injection appointments is very important and warned that if appointments are missed, protection will be minimal and the risk of acquiring HIV becomes much higher. Counseled on possible side effects associated with the injections such as injection site pain, which is usually mild to moderate in nature, injection site nodules, and injection site reactions. Asked to call the clinic or send me a mychart message if they experience any issues. Advised that they can take Motrin or Tylenol for injection site pain if needed. They may also pre-treat with Motrin or Tylenol about 30-45 minutes before scheduled appointments.  Alfonse Spruce, PharmD, CPP, BCIDP, [place] Clinical Pharmacist Practitioner [facility]

## 2022-10-08 NOTE — Assessment & Plan Note (Signed)
Discussed his sexual practices and recommended screening for GC/CT from oral and rectal sites.  He declines today.  We also discussed PrEP as he reports being on Truvada.  He is interested in  long acting Apretude and will discuss with our pharmacist.  If he stays on oral PrEP also advised he could switch to Descovy which might be safer long term from renal and bone standpoint and he should have HIV testing every 3 months per the guidelines.

## 2022-10-08 NOTE — Progress Notes (Signed)
[facility]  Reason for Consult: Syphilis  Referring Provider: Dr Felipe Drone   HPI:    Devin Hendricks is a 42 y.o. male with PMHx as below who presents to the clinic for syphilis.   In May/June 2023, patient was evaluated by the health department and his PCP for penile ulcers that was noted to be painful.  There was consideration of a broad differential including HSV, chancroid, LGV, or syphilis.  The note on 02/12/22 mentions he was given a dose of Bicillin 2.4 MU on that date but I am unable to find that this was done and phone note 5/19 notes he was needing to come back to office for Bicillin after his syphilis serology returned positive at 1:64.  He was given this Bicilllin injection on 02/16/22.  He was seen again on 03/06/22 with complaint of ongoing penile lesions.  Given another dose of Bicillin as well as having been on azithromycin, cipro, valtrex, and doxy per the note from that day by Dr Cherlynn Kaiser.    From June - December 2023, patient continued to follow up with his PCP office for other medical needs but appeared that his penile complaints had normalized and he reports the lesions healed (has scar tissue).  He was then seen on 09/17/22 with a penile sore on the end of his penis but reports not having been sexually active since the summer.  He was tested for GC/CT from urine which was negative.  A repeat RPR was done and his titer was 1:4.  He was given another dose of Bicillin 2.4 MU and referred to ID for further work up of his titer still being positive.  He reports that whatever lesion was noted on his penis has now resolved.   Patient's Medications  New Prescriptions   No medications on file  Previous Medications   ASPIRIN EC 81 MG TABLET    Take 1 tablet (81 mg total) by mouth daily. Swallow whole.   ATORVASTATIN (LIPITOR) 20 MG TABLET    Take 1 tablet (20 mg total) by mouth daily.   BUPROPION (WELLBUTRIN XL) 300 MG 24 HR TABLET       DILT-XR 120 MG  24 HR CAPSULE    Take 2 capsules (240 mg total) by mouth daily.   DILTIAZEM (CARTIA XT) 240 MG 24 HR CAPSULE    Take 1 capsule (240 mg total) by mouth daily.   EMTRICITABINE-TENOFOVIR (TRUVADA) 200-300 MG TABLET    Take 1 tablet by mouth daily.   LISINOPRIL (ZESTRIL) 30 MG TABLET    Take 1 tablet by mouth twice daily   TADALAFIL (CIALIS) 5 MG TABLET    Take 1 tablet (5 mg total) by mouth daily as needed for erectile dysfunction.   TRAZODONE (DESYREL) 150 MG TABLET    Take 2 tablets (300 mg total) by mouth at bedtime.  Modified Medications   No medications on file  Discontinued Medications   No medications on file      Past Medical History:  Diagnosis Date   Anxiety    Depression    Erectile dysfunction    Hyperlipidemia    Hypertension     Social History   Tobacco Use   Smoking status: Former    Packs/day: 1.00    Years: 20.00    Total pack years: 20.00    Types: Cigarettes    Quit date: 2020    Years since quitting: 4.0   Smokeless tobacco: Never  Vaping Use   Vaping Use: Never used  Substance Use Topics   Alcohol use: Not Currently   Drug use: Never    Family History  Problem Relation Age of Onset   Cancer Mother 31       lymphoma   Hypertension Mother    Hyperlipidemia Mother    Heart disease Mother    Early death Mother    Depression Mother    Diabetes Mother    Heart attack Mother     No Known Allergies  Review of Systems  All other systems reviewed and are negative.     OBJECTIVE:    Vitals:   10/08/22 1445  BP: 99/62  Pulse: 86  Resp: 16  Temp: 98.3 F (36.8 C)  TempSrc: Temporal  SpO2: 95%  Weight: 170 lb 3.2 oz (77.2 kg)  Height: 5\' 10"  (1.778 m)     Body mass index is 24.42 kg/m.  Physical Exam Constitutional:      Appearance: Normal appearance.  HENT:     Head: Normocephalic and atraumatic.  Pulmonary:     Effort: Pulmonary effort is normal. No respiratory distress.  Skin:    General: Skin is warm and dry.  Neurological:      General: No focal deficit present.     Mental Status: He is alert and oriented to person, place, and time.  Psychiatric:        Mood and Affect: Mood normal.        Behavior: Behavior normal.      Labs and Microbiology:     Latest Ref Rng & Units 04/24/2022    3:56 PM 03/13/2021    5:23 PM 01/05/2021   10:17 PM  CBC  WBC 4.0 - 10.5 K/uL 5.5  7.3  8.8   Hemoglobin 13.0 - 17.0 g/dL 14.2  13.5  15.4   Hematocrit 39.0 - 52.0 % 40.9  36.8  43.7   Platelets 150.0 - 400.0 K/uL 202.0  280  190       Latest Ref Rng & Units 04/24/2022    3:56 PM 04/13/2022    9:04 AM 11/19/2021    3:49 PM  CMP  Glucose 70 - 99 mg/dL 97  89  81   BUN 6 - 23 mg/dL 15  11  14    Creatinine 0.40 - 1.50 mg/dL 0.81  0.72  0.82   Sodium 135 - 145 mEq/L 133  134  135   Potassium 3.5 - 5.1 mEq/L 4.2  3.8  4.8   Chloride 96 - 112 mEq/L 98  99  99   CO2 19 - 32 mEq/L 28  27  29    Calcium 8.4 - 10.5 mg/dL 9.3  9.1  9.2   Total Protein 6.0 - 8.3 g/dL 7.5   6.8   Total Bilirubin 0.2 - 1.2 mg/dL 0.6   0.5   Alkaline Phos 39 - 117 U/L 34   47   AST 0 - 37 U/L 14   13   ALT 0 - 53 U/L 12   12      No results found for this or any previous visit (from the past 240 hour(s)).  Imaging:    ASSESSMENT & PLAN:    Early syphilis, genital (primary) Patient was diagnosed with early syphilis (penile ulcers) in May 2023 and treated at that time with a 1-time dose of Bicillin 2.4 MU.  This was adequate treatment for this stage of disease.  He also received a 2nd  dose 2.5 weeks later.  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  His initial RPR titer was 1:64 and now 6 months later is 1:4 which indicates an adequate response to treatment with 4-fold decrease in titer.  Discussed that his now resolved bump on penis likely would not suggest re-infection with syphilis as I would expect the titer to have been higher if this were another episode of early syphilis.  Nonetheless he  received another dose of Bicillin which would have been the treatment.  We discussed that often times the RPR titer will remain reactive at a low level and we monitor for a 4-fold decrease to assess treatment response (or 4-fold increase to suggest re-infection).  Slowly over time the RPR may drift back to non-reactive and he should have another RPR checked at 12 months from initial treatment (June 2024).  High risk sexual behavior Discussed his sexual practices and recommended screening for GC/CT from oral and rectal sites.  He declines today.  We also discussed PrEP as he reports being on Truvada.  He is interested in  long acting Apretude and will discuss with our pharmacist.  If he stays on oral PrEP also advised he could switch to Descovy which might be safer long term from renal and bone standpoint and he should have HIV testing every 3 months per the guidelines.      Vedia Coffer for Infectious Disease [facility] 10/08/2022, 3:33 PM   I have personally spent 60 minutes involved in face-to-face and non-face-to-face activities for this patient on the day of the visit. Professional time spent includes the following activities: Preparing to see the patient (review of tests), Obtaining and/or reviewing separately obtained history (admission/discharge record), Performing a medically appropriate examination and/or evaluation , Ordering medications/tests/procedures, referring and communicating with other health care professionals, Documenting clinical information in the EMR, Independently interpreting results (not separately reported), Communicating results to the patient/family/caregiver, Counseling and educating the patient/family/caregiver and Care coordination (not separately reported).

## 2022-10-08 NOTE — Assessment & Plan Note (Signed)
Patient was diagnosed with early syphilis (penile ulcers) in May 2023 and treated at that time with a 1-time dose of Bicillin 2.4 MU.  This was adequate treatment for this stage of disease.  He also received a 2nd dose 2.5 weeks later.  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  His initial RPR titer was 1:64 and now 6 months later is 1:4 which indicates an adequate response to treatment with 4-fold decrease in titer.  Discussed that his now resolved bump on penis likely would not suggest re-infection with syphilis as I would expect the titer to have been higher if this were another episode of early syphilis.  Nonetheless he received another dose of Bicillin which would have been the treatment.  We discussed that often times the RPR titer will remain reactive at a low level and we monitor for a 4-fold decrease to assess treatment response (or 4-fold increase to suggest re-infection).  Slowly over time the RPR may drift back to non-reactive and he should have another RPR checked at 12 months from initial treatment (June 2024).

## 2022-10-26 ENCOUNTER — Telehealth: Payer: Self-pay | Admitting: Family Medicine

## 2022-11-02 ENCOUNTER — Other Ambulatory Visit: Payer: Self-pay | Admitting: Family Medicine

## 2022-11-27 ENCOUNTER — Encounter: Payer: Self-pay | Admitting: Family Medicine

## 2022-11-27 ENCOUNTER — Ambulatory Visit: Payer: 59 | Admitting: Family Medicine

## 2022-11-27 VITALS — BP 105/69 | HR 73 | Temp 96.8°F | Ht 70.0 in | Wt 168.0 lb

## 2022-11-27 DIAGNOSIS — Z0001 Encounter for general adult medical examination with abnormal findings: Secondary | ICD-10-CM | POA: Diagnosis not present

## 2022-11-27 DIAGNOSIS — F338 Other recurrent depressive disorders: Secondary | ICD-10-CM | POA: Diagnosis not present

## 2022-11-27 DIAGNOSIS — R739 Hyperglycemia, unspecified: Secondary | ICD-10-CM | POA: Diagnosis not present

## 2022-11-27 DIAGNOSIS — Z125 Encounter for screening for malignant neoplasm of prostate: Secondary | ICD-10-CM

## 2022-11-27 DIAGNOSIS — I10 Essential (primary) hypertension: Secondary | ICD-10-CM

## 2022-11-27 DIAGNOSIS — N400 Enlarged prostate without lower urinary tract symptoms: Secondary | ICD-10-CM | POA: Insufficient documentation

## 2022-11-27 DIAGNOSIS — I471 Supraventricular tachycardia, unspecified: Secondary | ICD-10-CM | POA: Diagnosis not present

## 2022-11-27 DIAGNOSIS — I2510 Atherosclerotic heart disease of native coronary artery without angina pectoris: Secondary | ICD-10-CM | POA: Diagnosis not present

## 2022-11-27 DIAGNOSIS — I2584 Coronary atherosclerosis due to calcified coronary lesion: Secondary | ICD-10-CM

## 2022-11-27 MED ORDER — BUPROPION HCL ER (XL) 300 MG PO TB24: 300.0000 mg | ORAL_TABLET | Freq: Every day | ORAL | 3 refills | Status: DC

## 2022-11-27 MED ORDER — ATORVASTATIN CALCIUM 20 MG PO TABS: 20.0000 mg | ORAL_TABLET | Freq: Every day | ORAL | 3 refills | Status: DC

## 2022-11-27 MED ORDER — DILTIAZEM HCL ER COATED BEADS 240 MG PO CP24: 240.0000 mg | ORAL_CAPSULE | Freq: Every day | ORAL | 3 refills | Status: DC

## 2022-11-27 MED ORDER — LISINOPRIL 30 MG PO TABS: 30.0000 mg | ORAL_TABLET | Freq: Two times a day (BID) | ORAL | 3 refills | Status: DC

## 2022-11-27 MED ORDER — TRAZODONE HCL 150 MG PO TABS: 300.0000 mg | ORAL_TABLET | Freq: Every day | ORAL | 3 refills | Status: DC

## 2022-11-27 MED ORDER — TADALAFIL 5 MG PO TABS: 5.0000 mg | ORAL_TABLET | Freq: Every day | ORAL | 3 refills | Status: DC | PRN

## 2022-11-27 NOTE — Patient Instructions (Signed)
It was very nice to see you today!  I will refill your medications today.  We will check blood work.  Please continue work on diet and exercise.  I will see you back in year for your next physical.  Come back sooner if needed.  Take care,  Dr Jerline Pain  PLEASE NOTE:  If you had any lab tests, please let us know if you have not heard back within a few days. You may see your results on mychart before we have a chance to review them but we will give you a call once they are reviewed by [place].   If we ordered any referrals today, please let us know if you have not heard from their office within the next week.   If you had any urgent prescriptions sent in today, please check with the pharmacy within an hour of our visit to make sure the prescription was transmitted appropriately.   Please try these tips to maintain a healthy lifestyle:  Eat at least 3 REAL meals and 1-2 snacks per day.  Aim for no more than 5 hours between eating.  If you eat breakfast, please do so within one hour of getting up.   Each meal should contain half fruits/vegetables, one quarter protein, and one quarter carbs (no bigger than a computer mouse)  Cut down on sweet beverages. This includes juice, soda, and sweet tea.   Drink at least 1 glass of water with each meal and aim for at least 8 glasses per day  Exercise at least 150 minutes every week.    Preventive Care 53-41 Years Old, Male Preventive care refers to lifestyle choices and visits with your health care provider that can promote health and wellness. Preventive care visits are also called wellness exams. What can I expect for my preventive care visit? Counseling During your preventive care visit, your health care provider may ask about your: Medical history, including: Past medical problems. Family medical history. Current health, including: Emotional well-being. Home life and relationship well-being. Sexual activity. Lifestyle, including: Alcohol,  nicotine or tobacco, and drug use. Access to firearms. Diet, exercise, and sleep habits. Safety issues such as seatbelt and bike helmet use. Sunscreen use. Work and work Statistician. Physical exam Your health care provider will check your: Height and weight. These may be used to calculate your BMI (body mass index). BMI is a measurement that tells if you are at a healthy weight. Waist circumference. This measures the distance around your waistline. This measurement also tells if you are at a healthy weight and may help predict your risk of certain diseases, such as type 2 diabetes and high blood pressure. Heart rate and blood pressure. Body temperature. Skin for abnormal spots. What immunizations do I need?  Vaccines are usually given at various ages, according to a schedule. Your health care provider will recommend vaccines for you based on your age, medical history, and lifestyle or other factors, such as travel or where you work. What tests do I need? Screening Your health care provider may recommend screening tests for certain conditions. This may include: Lipid and cholesterol levels. Diabetes screening. This is done by checking your blood sugar (glucose) after you have not eaten for a while (fasting). Hepatitis B test. Hepatitis C test. HIV (human immunodeficiency virus) test. STI (sexually transmitted infection) testing, if you are at risk. Lung cancer screening. Prostate cancer screening. Colorectal cancer screening. Talk with your health care provider about your test results, treatment options, and if necessary,  the need for more tests. Follow these instructions at home: Eating and drinking  Eat a diet that includes fresh fruits and vegetables, whole grains, lean protein, and low-fat dairy products. Take vitamin and mineral supplements as recommended by your health care provider. Do not drink alcohol if your health care provider tells you not to drink. If you drink  alcohol: Limit how much you have to 0-2 drinks a day. Know how much alcohol is in your drink. In the [place] one drink equals one 12 oz bottle of beer (355 mL), one 5 oz glass of wine (148 mL), or one 1 oz glass of hard liquor (44 mL). Lifestyle Brush your teeth every morning and night with fluoride toothpaste. Floss one time each day. Exercise for at least 30 minutes 5 or more days each week. Do not use any products that contain nicotine or tobacco. These products include cigarettes, chewing tobacco, and vaping devices, such as e-cigarettes. If you need help quitting, ask your health care provider. Do not use drugs. If you are sexually active, practice safe sex. Use a condom or other form of protection to prevent STIs. Take aspirin only as told by your health care provider. Make sure that you understand how much to take and what form to take. Work with your health care provider to find out whether it is safe and beneficial for you to take aspirin daily. Find healthy ways to manage stress, such as: Meditation, yoga, or listening to music. Journaling. Talking to a trusted person. Spending time with friends and family. Minimize exposure to UV radiation to reduce your risk of skin cancer. Safety Always wear your seat belt while driving or riding in a vehicle. Do not drive: If you have been drinking alcohol. Do not ride with someone who has been drinking. When you are tired or distracted. While texting. If you have been using any mind-altering substances or drugs. Wear a helmet and other protective equipment during sports activities. If you have firearms in your house, make sure you follow all gun safety procedures. What's next? Go to your health care provider once a year for an annual wellness visit. Ask your health care provider how often you should have your eyes and teeth checked. Stay up to date on all vaccines. This information is not intended to replace advice given to you by your  health care provider. Make sure you discuss any questions you have with your health care provider. Document Revised: 03/12/2021 Document Reviewed: 03/12/2021 Elsevier Patient Education  [place].

## 2022-11-27 NOTE — Assessment & Plan Note (Signed)
Longstanding issue.  He has been prescribed Cialis 5 mg daily by previous PCP.  This helps with BPH symptoms.  Will refill today.  Check PSA.

## 2022-11-27 NOTE — Assessment & Plan Note (Signed)
Follows with cardiology.  Has a strong family history of early CAD.  He is on aspirin and statin.  Check lipids today.

## 2022-11-27 NOTE — Progress Notes (Signed)
Chief Complaint:  Devin Hendricks is a 42 y.o. male who presents today for his annual comprehensive physical exam.    Assessment/Plan:  Chronic Problems Addressed Today: PSVT (paroxysmal supraventricular tachycardia) ([place]) Has followed with cardiology for this.  No recent episodes.  He is on diltiazem 240 mg daily and tolerating well.  Will refill today.  Essential hypertension Blood pressure is well controlled on current regimen diltiazem 240 mg daily lisinopril 30 mg daily.  Coronary artery calcification Follows with cardiology.  Has a strong family history of early CAD.  He is on aspirin and statin.  Check lipids today.  BPH (benign prostatic hyperplasia) Longstanding issue.  He has been prescribed Cialis 5 mg daily by previous PCP.  This helps with BPH symptoms.  Will refill today.  Check PSA.  Seasonal affective disorder (HCC) Stable on Wellbutrin 300 mg daily as needed seasonally.  Will refill today.  No reported SI or HI.  Preventative Healthcare: Check labs.  Cleared for colonoscopy next year.  Patient Counseling(The following topics were reviewed and/or handout was given):  -Nutrition: Stressed importance of moderation in sodium/caffeine intake, saturated fat and cholesterol, caloric balance, sufficient intake of fresh fruits, vegetables, and fiber.  -Stressed the importance of regular exercise.   -Substance Abuse: Discussed cessation/primary prevention of tobacco, alcohol, or other drug use; driving or other dangerous activities under the influence; availability of treatment for abuse.   -Injury prevention: Discussed safety belts, safety helmets, smoke detector, smoking near bedding or upholstery.   -Sexuality: Discussed sexually transmitted diseases, partner selection, use of condoms, avoidance of unintended pregnancy and contraceptive alternatives.   -Dental health: Discussed importance of regular tooth brushing, flossing, and dental visits.  -Health maintenance and  immunizations reviewed. Please refer to Health maintenance section.  Return to care in 1 year for next preventative visit.     Subjective:  HPI:  He has no acute complaints today.   Lifestyle Diet: Balanced. Plenty of fruits and vegetables.  Exercise: Gym 3-4 times per week.      11/27/2022    2:05 PM  Depression screen PHQ 2/9  Decreased Interest 0  Down, Depressed, Hopeless 0  PHQ - 2 Score 0    There are no preventive care reminders to display for this patient.   ROS: Per HPI, otherwise a complete review of systems was negative.   PMH:  The following were reviewed and entered/updated in epic: Past Medical History:  Diagnosis Date   Anxiety    Depression    Erectile dysfunction    Hyperlipidemia    Hypertension    Patient Active Problem List   Diagnosis Date Noted   BPH (benign prostatic hyperplasia) 11/27/2022   Seasonal affective disorder ([place]) 11/27/2022   High risk sexual behavior 10/08/2022   Early syphilis, genital (primary) 03/17/2022   Coronary artery calcification 05/01/2021   PSVT (paroxysmal supraventricular tachycardia) 05/01/2021   Essential hypertension 05/01/2021   Family history of early CAD 03/17/2021   Past Surgical History:  Procedure Laterality Date   BLEPHAROPLASTY      Family History  Problem Relation Age of Onset   Cancer Mother 94       lymphoma   Hypertension Mother    Hyperlipidemia Mother    Heart disease Mother    Early death Mother    Depression Mother    Diabetes Mother    Heart attack Mother     Medications- reviewed and updated Current Outpatient Medications  Medication Sig Dispense Refill   aspirin EC  81 MG tablet Take 1 tablet (81 mg total) by mouth daily. Swallow whole. 90 tablet 3   buPROPion (WELLBUTRIN XL) 300 MG 24 hr tablet Take 1 tablet (300 mg total) by mouth daily. 90 tablet 3   emtricitabine-tenofovir (TRUVADA) 200-300 MG tablet Take 1 tablet by mouth daily. 90 tablet 1   atorvastatin (LIPITOR) 20 MG  tablet Take 1 tablet (20 mg total) by mouth daily. 90 tablet 3   diltiazem (CARDIZEM CD) 240 MG 24 hr capsule Take 1 capsule (240 mg total) by mouth daily. 90 capsule 3   lisinopril (ZESTRIL) 30 MG tablet Take 1 tablet (30 mg total) by mouth 2 (two) times daily. 90 tablet 3   tadalafil (CIALIS) 5 MG tablet Take 1 tablet (5 mg total) by mouth daily as needed for erectile dysfunction. 90 tablet 3   traZODone (DESYREL) 150 MG tablet Take 2 tablets (300 mg total) by mouth at bedtime. 180 tablet 3   No current facility-administered medications for this visit.    Allergies-reviewed and updated No Known Allergies  Social History   Socioeconomic History   Marital status: Divorced    Spouse name: Not on file   Number of children: 2   Years of education: Not on file   Highest education level: Not on file  Occupational History   Not on file  Tobacco Use   Smoking status: Former    Packs/day: 1.00    Years: 20.00    Total pack years: 20.00    Types: Cigarettes    Quit date: 2020    Years since quitting: 4.1   Smokeless tobacco: Never  Vaping Use   Vaping Use: Never used  Substance and Sexual Activity   Alcohol use: Not Currently   Drug use: Never   Sexual activity: Yes  Other Topics Concern   Not on file  Social History Narrative   Children 1 adopted 1 natural      Self-media production   Social Determinants of Health   Financial Resource Strain: Not on file  Food Insecurity: Not on file  Transportation Needs: Not on file  Physical Activity: Not on file  Stress: Not on file  Social Connections: Not on file        Objective:  Physical Exam: BP 105/69   Pulse 73   Temp (!) 96.8 F (36 C) (Temporal)   Ht '5\' 10"'$  (1.778 m)   Wt 168 lb (76.2 kg)   SpO2 98%   BMI 24.11 kg/m   Body mass index is 24.11 kg/m. Wt Readings from Last 3 Encounters:  11/27/22 168 lb (76.2 kg)  10/08/22 170 lb 3.2 oz (77.2 kg)  09/17/22 162 lb (73.5 kg)   Gen: NAD, resting  comfortably HEENT: TMs normal bilaterally. OP clear. No thyromegaly noted.  CV: RRR with no murmurs appreciated Pulm: NWOB, CTAB with no crackles, wheezes, or rhonchi GI: Normal bowel sounds present. Soft, Nontender, Nondistended. MSK: no edema, cyanosis, or clubbing noted Skin: warm, dry Neuro: CN2-12 grossly intact. Strength 5/5 in upper and lower extremities. Reflexes symmetric and intact bilaterally.  Psych: Normal affect and thought content     Davey Limas M. Jerline Pain, MD 11/27/2022 3:02 PM

## 2022-11-27 NOTE — Assessment & Plan Note (Signed)
Stable on Wellbutrin 300 mg daily as needed seasonally.  Will refill today.  No reported SI or HI.

## 2022-11-27 NOTE — Assessment & Plan Note (Signed)
Has followed with cardiology for this.  No recent episodes.  He is on diltiazem 240 mg daily and tolerating well.  Will refill today.

## 2022-11-27 NOTE — Assessment & Plan Note (Signed)
Blood pressure is well controlled on current regimen diltiazem 240 mg daily lisinopril 30 mg daily.

## 2022-11-28 LAB — LIPID PANEL
Cholesterol: 114 mg/dL (ref <200)
HDL: 50 mg/dL (ref >=40)
LDL Cholesterol (Calc): 50 mg/dL (calc)
Non-HDL Cholesterol (Calc): 64 mg/dL (calc) (ref <130)
Total CHOL/HDL Ratio: 2.3 (calc) (ref <5.0)
Triglycerides: 62 mg/dL (ref <150)

## 2022-11-28 LAB — COMPREHENSIVE METABOLIC PANEL
AG Ratio: 2.3 (calc) (ref 1.0–2.5)
ALT: 22 U/L (ref 9–46)
AST: 21 U/L (ref 10–40)
Albumin: 5 g/dL (ref 3.6–5.1)
Alkaline phosphatase (APISO): 43 U/L (ref 36–130)
BUN: 18 mg/dL (ref 7–25)
CO2: 27 mmol/L (ref 20–32)
Calcium: 9.7 mg/dL (ref 8.6–10.3)
Chloride: 102 mmol/L (ref 98–110)
Creat: 0.74 mg/dL (ref 0.60–1.29)
Globulin: 2.2 g/dL (calc) (ref 1.9–3.7)
Glucose, Bld: 83 mg/dL (ref 65–99)
Potassium: 5.9 mmol/L — ABNORMAL HIGH (ref 3.5–5.3)
Sodium: 136 mmol/L (ref 135–146)
Total Bilirubin: 0.7 mg/dL (ref 0.2–1.2)
Total Protein: 7.2 g/dL (ref 6.1–8.1)

## 2022-11-28 LAB — HEMOGLOBIN A1C
Hgb A1c MFr Bld: 4.6 % of total Hgb (ref <5.7)
Mean Plasma Glucose: 85 mg/dL
eAG (mmol/L): 4.7 mmol/L

## 2022-11-28 LAB — TSH: TSH: 0.81 mIU/L (ref 0.40–4.50)

## 2022-11-28 LAB — CBC
HCT: 42.9 % (ref 38.5–50.0)
Hemoglobin: 14.6 g/dL (ref 13.2–17.1)
MCH: 32.5 pg (ref 27.0–33.0)
MCHC: 34 g/dL (ref 32.0–36.0)
MCV: 95.5 fL (ref 80.0–100.0)
MPV: 9 fL (ref 7.5–12.5)
Platelets: 211 10*3/uL (ref 140–400)
RBC: 4.49 10*6/uL (ref 4.20–5.80)
RDW: 11.6 % (ref 11.0–15.0)
WBC: 4.2 10*3/uL (ref 3.8–10.8)

## 2022-11-28 LAB — PSA: PSA: 0.75 ng/mL (ref <=4.00)

## 2022-12-01 ENCOUNTER — Encounter: Payer: Self-pay | Admitting: Family Medicine

## 2022-12-01 NOTE — Progress Notes (Signed)
Please inform patient of the following:  Potassium is elevated.  This is chronic and late a lab error.  Recommend he go to the low back the office to recheck.  All of his other labs are normal and we can recheck in a year.

## 2022-12-02 ENCOUNTER — Other Ambulatory Visit: Payer: Self-pay

## 2022-12-02 ENCOUNTER — Other Ambulatory Visit: Payer: 59

## 2022-12-02 DIAGNOSIS — E875 Hyperkalemia: Secondary | ICD-10-CM

## 2022-12-02 LAB — COMPREHENSIVE METABOLIC PANEL
AG Ratio: 2.2 (calc) (ref 1.0–2.5)
ALT: 14 U/L (ref 9–46)
AST: 16 U/L (ref 10–40)
Albumin: 4.8 g/dL (ref 3.6–5.1)
Alkaline phosphatase (APISO): 39 U/L (ref 36–130)
BUN: 20 mg/dL (ref 7–25)
CO2: 21 mmol/L (ref 20–32)
Calcium: 9.4 mg/dL (ref 8.6–10.3)
Chloride: 103 mmol/L (ref 98–110)
Creat: 0.73 mg/dL (ref 0.60–1.29)
Globulin: 2.2 g/dL (calc) (ref 1.9–3.7)
Glucose, Bld: 87 mg/dL (ref 65–99)
Potassium: 4.5 mmol/L (ref 3.5–5.3)
Sodium: 135 mmol/L (ref 135–146)
Total Bilirubin: 0.6 mg/dL (ref 0.2–1.2)
Total Protein: 7 g/dL (ref 6.1–8.1)

## 2022-12-02 NOTE — Telephone Encounter (Signed)
See result note.  .cmp

## 2022-12-02 NOTE — Addendum Note (Signed)
Addended by: Clois Dupes D on: 12/02/2022 03:27 PM   Modules accepted: Orders

## 2022-12-04 NOTE — Progress Notes (Signed)
Please inform patient of the following:  Potassium back to normal. Do not need to do any further testing.  Devin Hendricks. Jerline Pain, MD 12/04/2022 7:54 AM

## 2022-12-09 IMAGING — CR DG CHEST 2V
2 series · 2 of 2 positions shown · non-contrast
Comparison: None.

CLINICAL DATA: Chest pain

EXAM:
CHEST - 2 VIEW

[w chest pa]
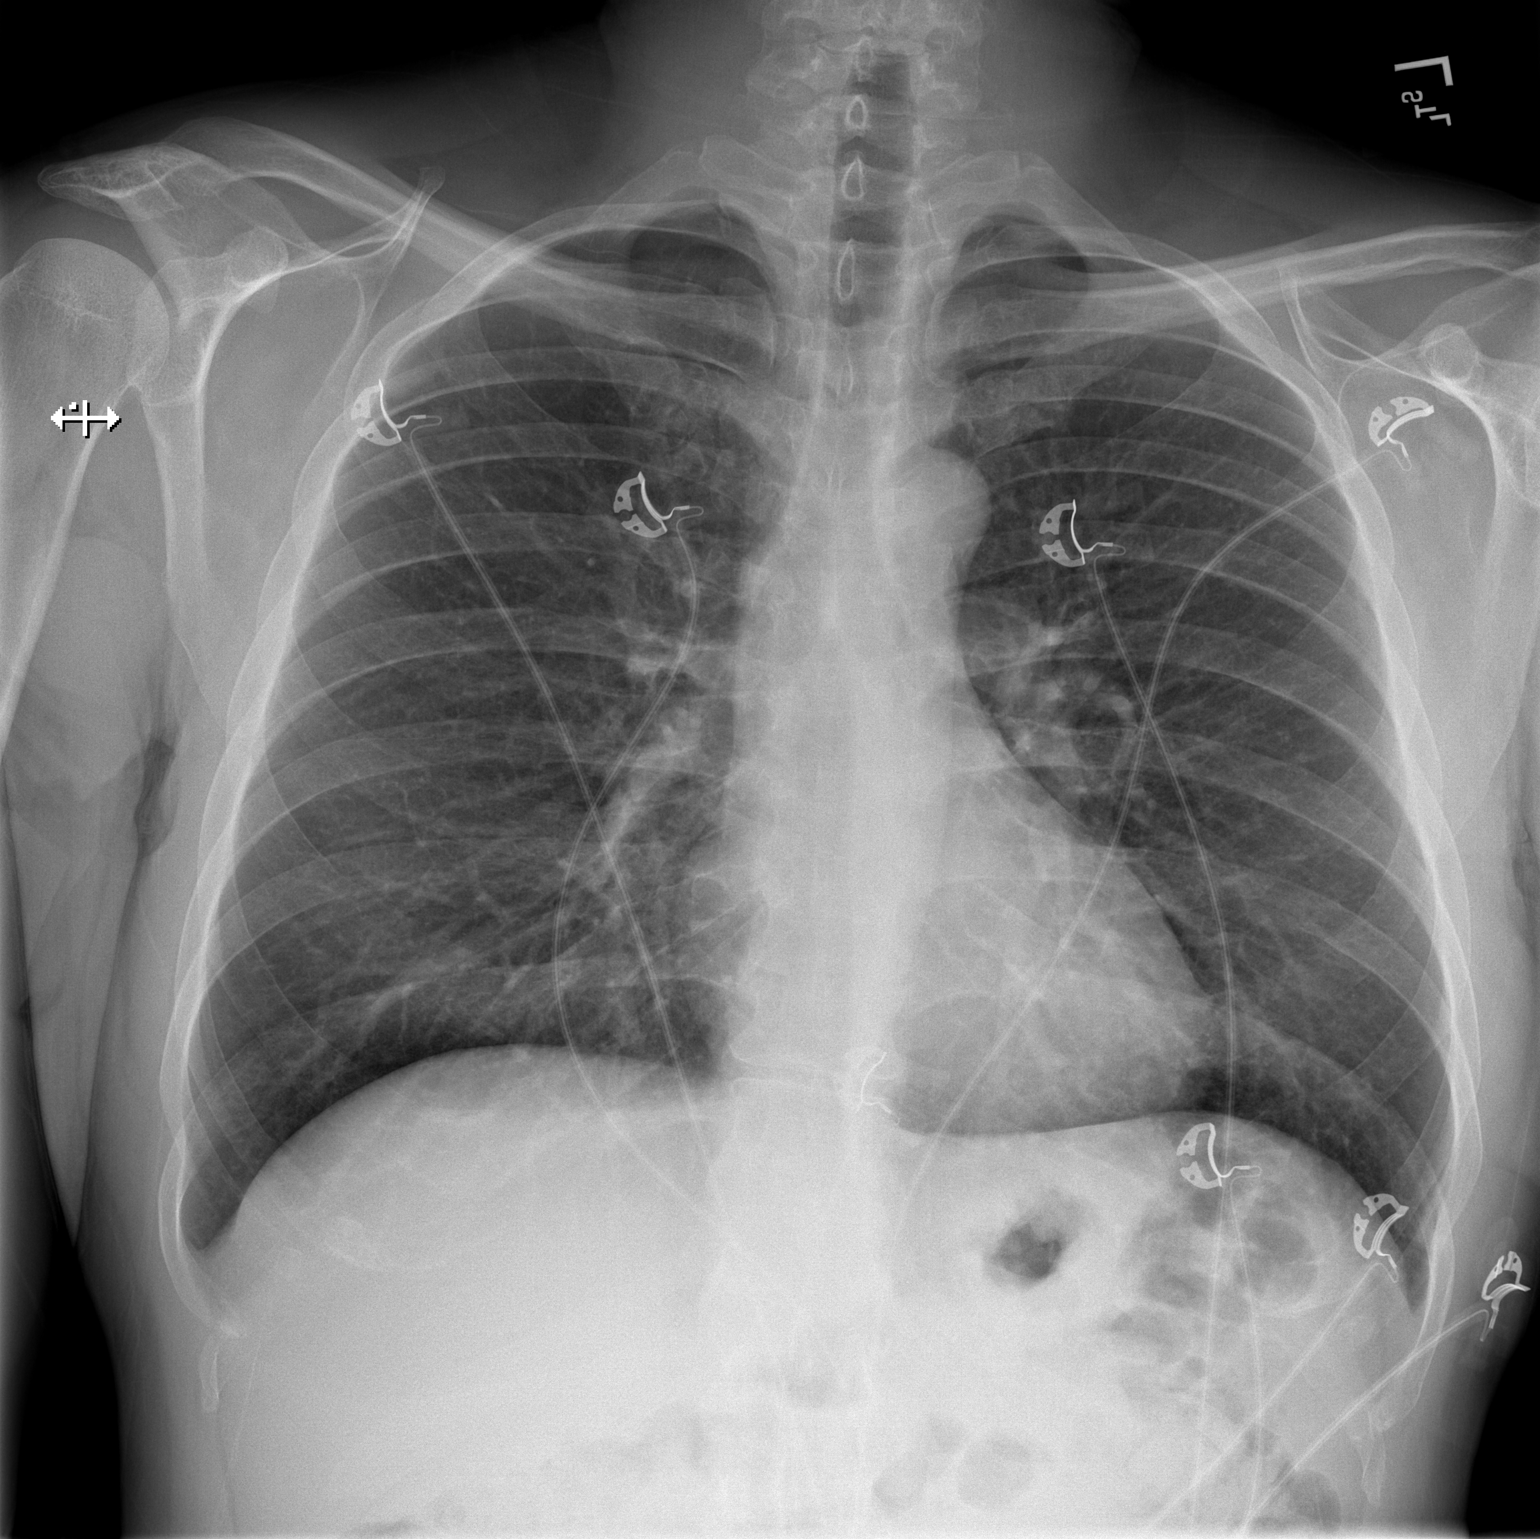

[w chest lat]
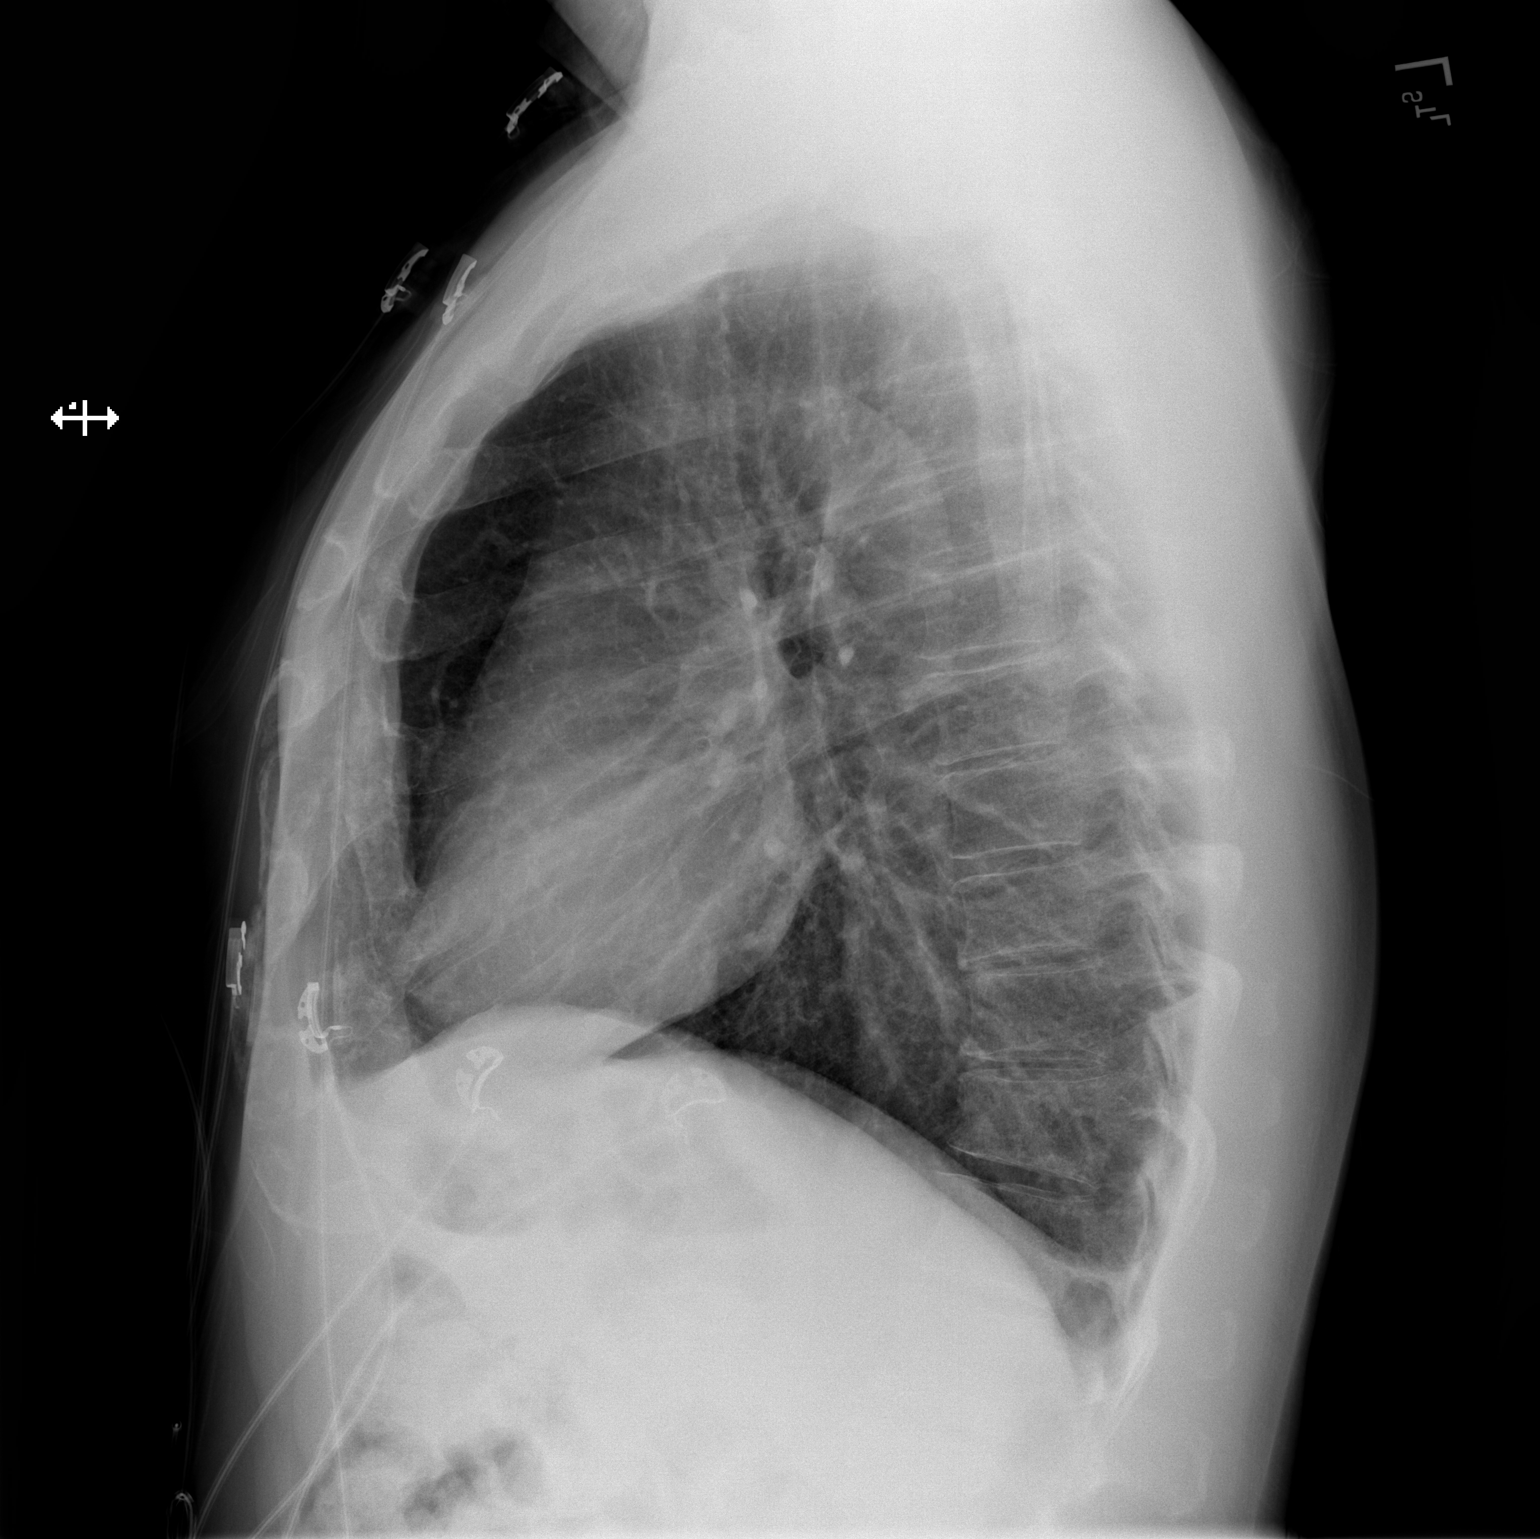

[2 of 2 positions shown; findings below may reference images not displayed]

FINDINGS: The heart size and mediastinal contours are within normal limits.
Both lungs are clear. The visualized skeletal structures are
unremarkable.
IMPRESSION: No active cardiopulmonary disease.

## 2023-02-14 IMAGING — DX DG CHEST 2V
2 series · 2 of 2 positions shown · non-contrast
Comparison: 01/05/2021

CLINICAL DATA: Chest pain

EXAM:
CHEST - 2 VIEW

[chest pa]
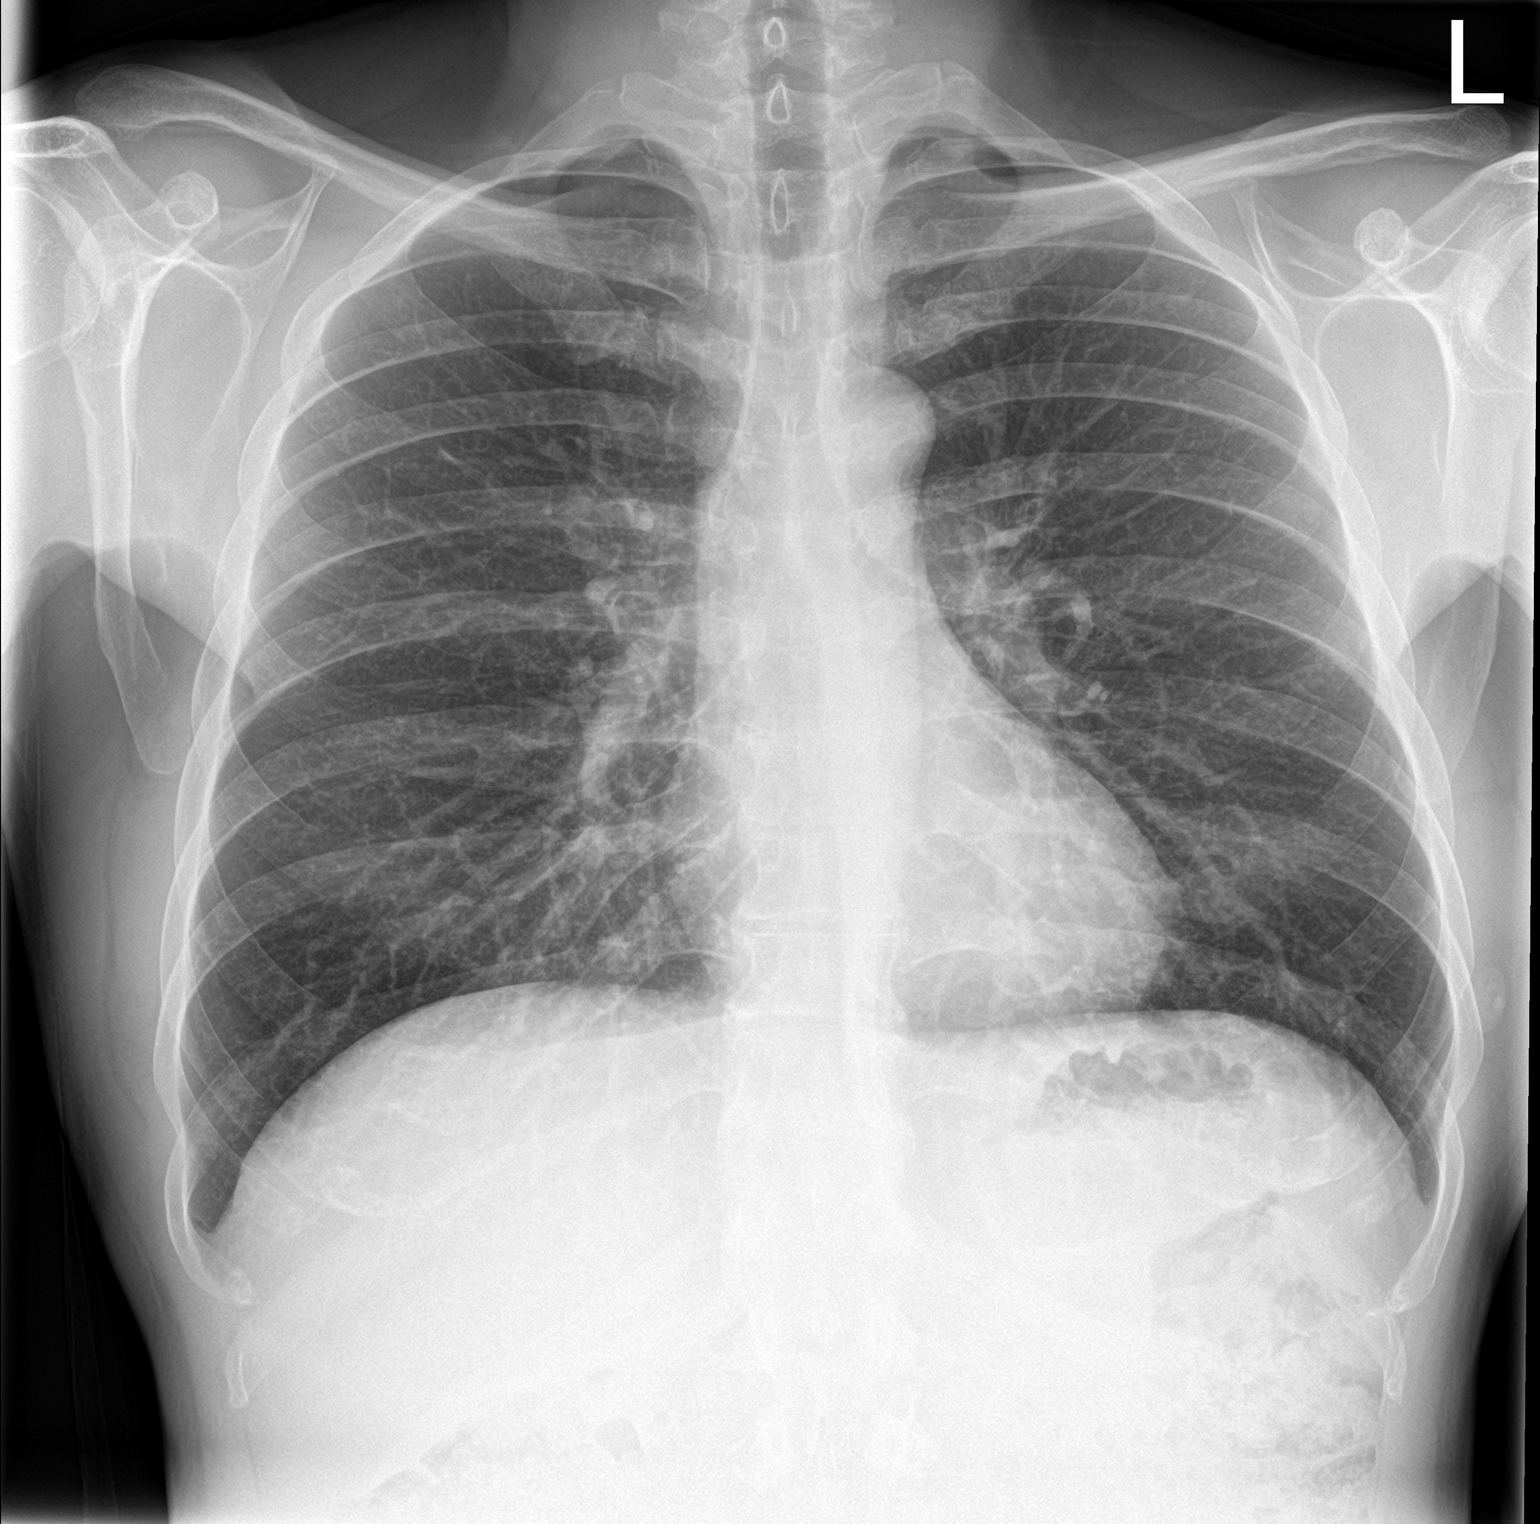

[chest lat]
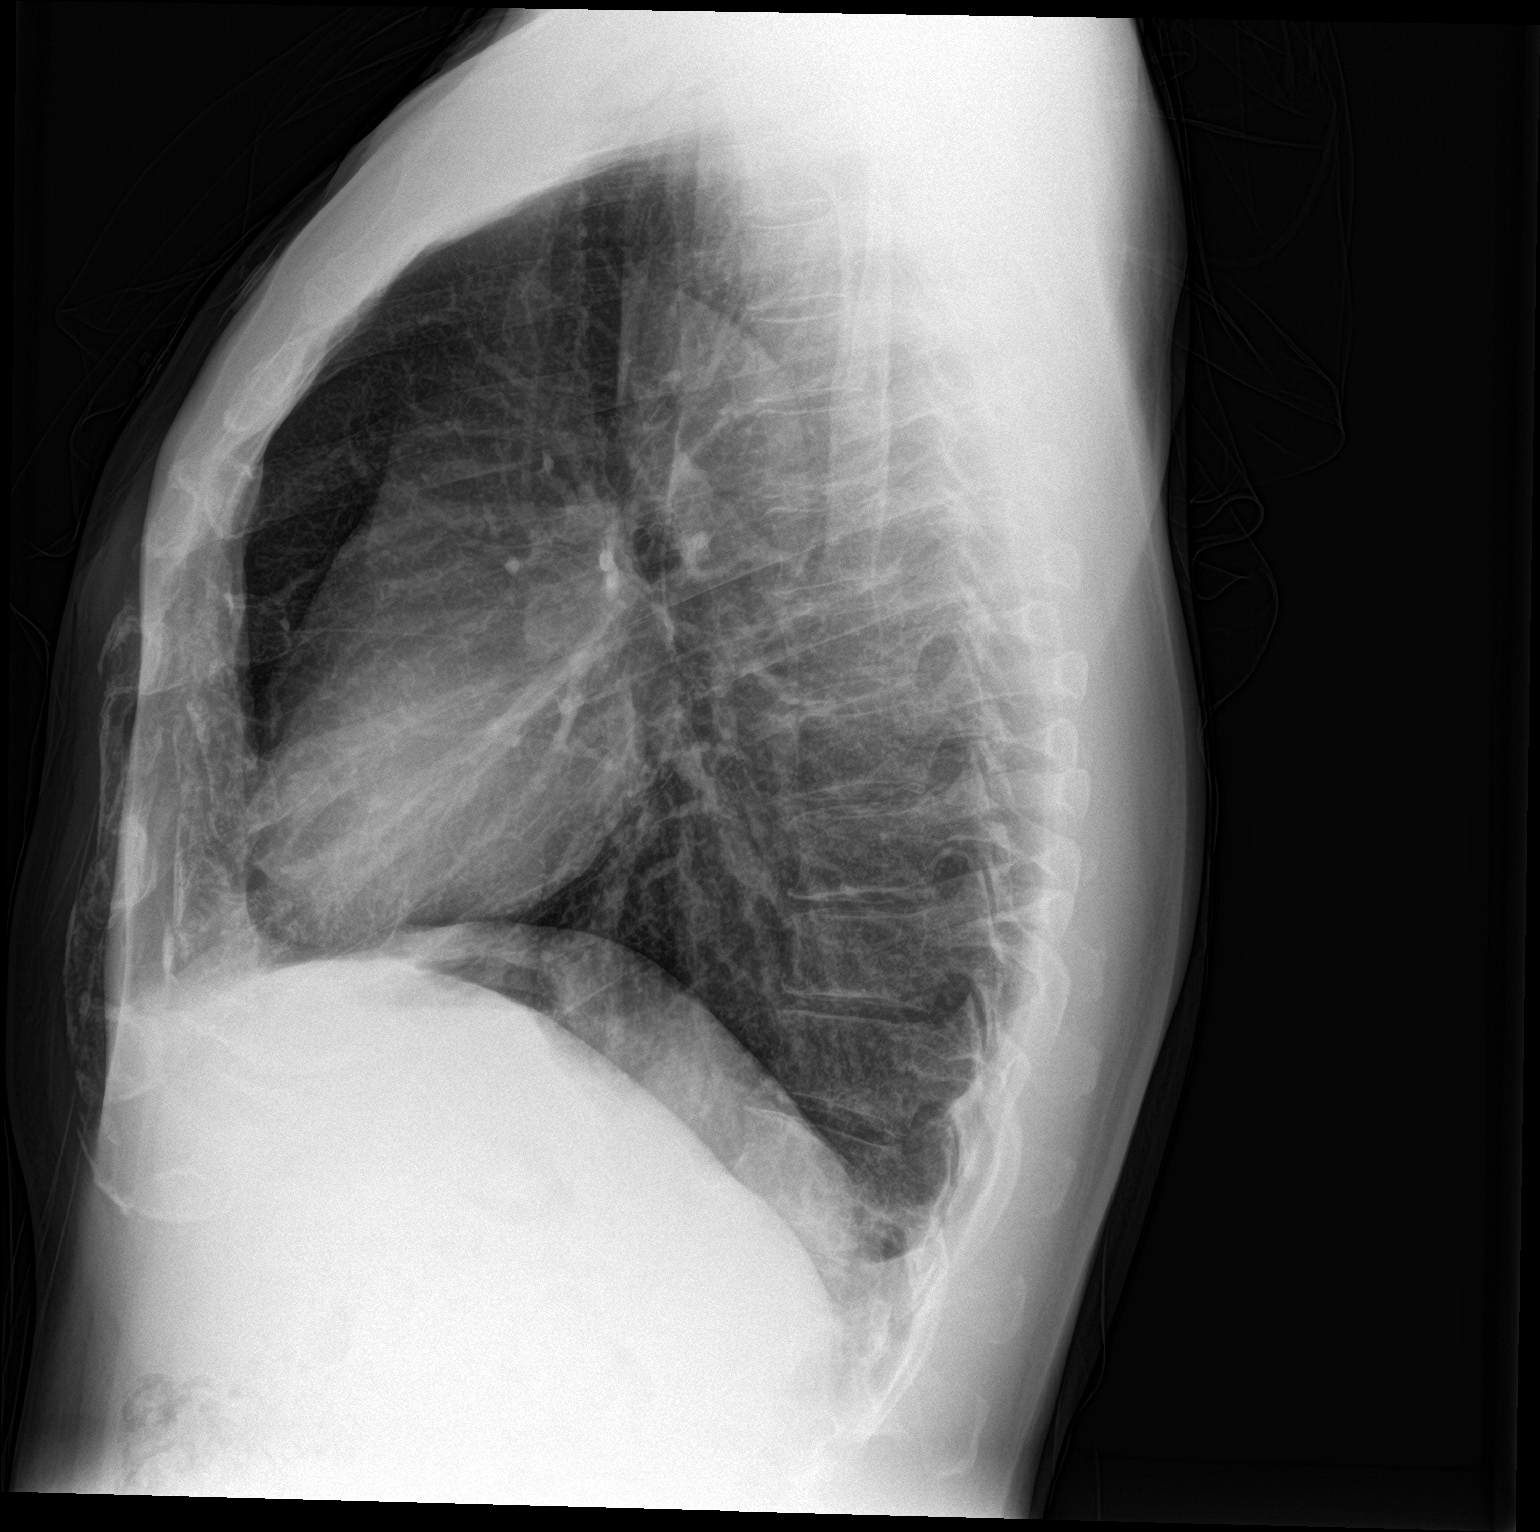

[2 of 2 positions shown; findings below may reference images not displayed]

FINDINGS: The heart size and mediastinal contours are within normal limits.
Both lungs are clear. The visualized skeletal structures are
unremarkable.
IMPRESSION: No active cardiopulmonary disease.

## 2023-05-22 ENCOUNTER — Other Ambulatory Visit: Payer: Self-pay | Admitting: Family Medicine

## 2023-06-24 ENCOUNTER — Other Ambulatory Visit: Payer: Self-pay | Admitting: *Deleted

## 2023-06-24 MED ORDER — LISINOPRIL 30 MG PO TABS: 30.0000 mg | ORAL_TABLET | Freq: Two times a day (BID) | ORAL | 0 refills | Status: DC

## 2023-08-31 ENCOUNTER — Ambulatory Visit: Payer: 59 | Admitting: Family Medicine

## 2023-08-31 VITALS — BP 113/66 | HR 75 | Temp 97.7°F | Ht 70.0 in | Wt 167.6 lb

## 2023-08-31 DIAGNOSIS — R61 Generalized hyperhidrosis: Secondary | ICD-10-CM

## 2023-08-31 DIAGNOSIS — A510 Primary genital syphilis: Secondary | ICD-10-CM

## 2023-08-31 DIAGNOSIS — N400 Enlarged prostate without lower urinary tract symptoms: Secondary | ICD-10-CM

## 2023-08-31 NOTE — Assessment & Plan Note (Signed)
Recheck RPR.  Last titer was 1-4 which indicated adequate response.

## 2023-08-31 NOTE — Patient Instructions (Signed)
It was very nice to see you today!  We will check blood work today.  We may treat with antibiotics if your blood work is all normal.  Please let us know if you have any change in symptoms.  Return if symptoms worsen or fail to improve.   Take care, Dr Jimmey Ralph  PLEASE NOTE:  If you had any lab tests, please let us know if you have not heard back within a few days. You may see your results on mychart before we have a chance to review them but we will give you a call once they are reviewed by [place].   If we ordered any referrals today, please let us know if you have not heard from their office within the next week.   If you had any urgent prescriptions sent in today, please check with the pharmacy within an hour of our visit to make sure the prescription was transmitted appropriately.   Please try these tips to maintain a healthy lifestyle:  Eat at least 3 REAL meals and 1-2 snacks per day.  Aim for no more than 5 hours between eating.  If you eat breakfast, please do so within one hour of getting up.   Each meal should contain half fruits/vegetables, one quarter protein, and one quarter carbs (no bigger than a computer mouse)  Cut down on sweet beverages. This includes juice, soda, and sweet tea.   Drink at least 1 glass of water with each meal and aim for at least 8 glasses per day  Exercise at least 150 minutes every week.

## 2023-08-31 NOTE — Progress Notes (Signed)
   Yance Seaborne is a 42 y.o. male who presents today for an office visit.  Assessment/Plan:  New/Acute Problems: Night Sweats / Malaise Broad differential.  Overall his exam today is reassuring.  We will check labs today including CBC, c-Met, CRP, TSH, testosterone, and A1c. We are also rechecking his RPR today however he declined checking for other STDs.  It is also possible that he may have prostatitis given his concurrent erectile issues and urinary hesitancy though he has not having any perineal pain.  We will check UA and urine culture.  If symptoms persist and above workup is negative we will consider empiric trial of antibiotics to treat prostatitis.  We discussed reasons to return to care and seek emergent care.  If still no improvement in symptoms despite all of the above, we will need to treat as FUO and proceed with imaging at that point.  Chronic Problems Addressed Today: Early syphilis, genital (primary) Recheck RPR.  Last titer was 1-4 which indicated adequate response.  BPH (benign prostatic hyperplasia) On Cialis 5 mg daily.  He is having more urinary hesitancy and may have mild prostatitis.  We are managing this as above.  We could consider increasing dose of Cialis at some point in the future versus adding on Flomax if this continues to be an issue.  Also may consider referral to urology if needed.  Will hold off on any medication changes are awaiting his above workup.     Subjective:  HPI:  See Assessment / plan for status of chronic conditions.  Patient here today with multiple concerns.  He is worried about potential hormonal imbalance.  Over the last several weeks to months he has noticed several new symptoms including cold sensitivity, dry skin, hair falling out, erectile dysfunction, urinary hesitancy, malaise, and fatigue.  Also has recently developed night sweats the last couple of nights associated with chills.  States that the sweats change through his sheets.  No  recent illnesses.  No cough or congestion.  No abdominal pain.  No dysuria.  No hematuria.  No nausea or vomiting.  No constipation or diarrhea.  No headache.  No weakness or numbness.  No vision changes.  No testicular pain.  No penile pain.  No perineal pain.        Objective:  Physical Exam: BP 113/66   Pulse 75   Temp 97.7 F (36.5 C) (Temporal)   Ht 5\' 10"  (1.778 m)   Wt 167 lb 9.6 oz (76 kg)   SpO2 96%   BMI 24.05 kg/m   Gen: No acute distress, resting comfortably CV: Regular rate and rhythm with no murmurs appreciated Pulm: Normal work of breathing, clear to auscultation bilaterally with no crackles, wheezes, or rhonchi Neuro: Grossly normal, moves all extremities Psych: Normal affect and thought content      Minervia Osso M. Jimmey Ralph, MD 08/31/2023 3:24 PM

## 2023-08-31 NOTE — Assessment & Plan Note (Signed)
On Cialis 5 mg daily.  He is having more urinary hesitancy and may have mild prostatitis.  We are managing this as above.  We could consider increasing dose of Cialis at some point in the future versus adding on Flomax if this continues to be an issue.  Also may consider referral to urology if needed.  Will hold off on any medication changes are awaiting his above workup.

## 2023-09-01 ENCOUNTER — Other Ambulatory Visit: Payer: 59

## 2023-09-01 DIAGNOSIS — R61 Generalized hyperhidrosis: Secondary | ICD-10-CM | POA: Diagnosis not present

## 2023-09-01 LAB — URINALYSIS, ROUTINE W REFLEX MICROSCOPIC
Bilirubin Urine: NEGATIVE
Hgb urine dipstick: NEGATIVE
Ketones, ur: NEGATIVE
Nitrite: NEGATIVE
RBC / HPF: NONE SEEN (ref 0–?)
Specific Gravity, Urine: <=1.005 — AB (ref 1.000–1.030)
Total Protein, Urine: NEGATIVE
Urine Glucose: NEGATIVE
Urobilinogen, UA: 0.2 (ref 0.0–1.0)
pH: 7 (ref 5.0–8.0)

## 2023-09-01 LAB — CBC WITH DIFFERENTIAL/PLATELET
Basophils Absolute: 0.1 10*3/uL (ref 0.0–0.1)
Basophils Relative: 1.3 % (ref 0.0–3.0)
Eosinophils Absolute: 0.1 10*3/uL (ref 0.0–0.7)
Eosinophils Relative: 1.1 % (ref 0.0–5.0)
HCT: 36.2 % — ABNORMAL LOW (ref 39.0–52.0)
Hemoglobin: 12.3 g/dL — ABNORMAL LOW (ref 13.0–17.0)
Lymphocytes Relative: 20.4 % (ref 12.0–46.0)
Lymphs Abs: 1.4 10*3/uL (ref 0.7–4.0)
MCHC: 34.1 g/dL (ref 30.0–36.0)
MCV: 111.2 fL — ABNORMAL HIGH (ref 78.0–100.0)
Monocytes Absolute: 0.3 10*3/uL (ref 0.1–1.0)
Monocytes Relative: 3.8 % (ref 3.0–12.0)
Neutro Abs: 4.9 10*3/uL (ref 1.4–7.7)
Neutrophils Relative %: 73.4 % (ref 43.0–77.0)
Platelets: 270 10*3/uL (ref 150.0–400.0)
RBC: 3.25 Mil/uL — ABNORMAL LOW (ref 4.22–5.81)
RDW: 13.9 % (ref 11.5–15.5)
WBC: 6.7 10*3/uL (ref 4.0–10.5)

## 2023-09-01 LAB — COMPREHENSIVE METABOLIC PANEL
ALT: 24 U/L (ref 0–53)
AST: 30 U/L (ref 0–37)
Albumin: 5 g/dL (ref 3.5–5.2)
Alkaline Phosphatase: 31 U/L — ABNORMAL LOW (ref 39–117)
BUN: 16 mg/dL (ref 6–23)
CO2: 26 meq/L (ref 19–32)
Calcium: 10.2 mg/dL (ref 8.4–10.5)
Chloride: 99 meq/L (ref 96–112)
Creatinine, Ser: 0.72 mg/dL (ref 0.40–1.50)
GFR: 111.16 mL/min (ref >60.00)
Glucose, Bld: 98 mg/dL (ref 70–99)
Potassium: 4.3 meq/L (ref 3.5–5.1)
Sodium: 133 meq/L — ABNORMAL LOW (ref 135–145)
Total Bilirubin: 1.5 mg/dL — ABNORMAL HIGH (ref 0.2–1.2)
Total Protein: 7.1 g/dL (ref 6.0–8.3)

## 2023-09-01 LAB — URINE CULTURE
MICRO NUMBER:: 15802271
Result:: NO GROWTH
SPECIMEN QUALITY:: ADEQUATE

## 2023-09-01 LAB — TESTOSTERONE, FREE, TOTAL, SHBG
Sex Hormone Binding: 54.6 nmol/L (ref 16.5–55.9)
Testosterone, Free: 10.9 pg/mL (ref 6.8–21.5)
Testosterone: 590 ng/dL (ref 264–916)

## 2023-09-01 LAB — T4, FREE: Free T4: 1.07 ng/dL (ref 0.60–1.60)

## 2023-09-01 LAB — PSA: PSA: 0.92 ng/mL (ref 0.10–4.00)

## 2023-09-01 LAB — TSH: TSH: 0.66 u[IU]/mL (ref 0.35–5.50)

## 2023-09-01 LAB — C-REACTIVE PROTEIN: CRP: <1 mg/dL (ref 0.5–20.0)

## 2023-09-01 LAB — T3, FREE: T3, Free: 3.3 pg/mL (ref 2.3–4.2)

## 2023-09-02 ENCOUNTER — Encounter: Payer: Self-pay | Admitting: Family Medicine

## 2023-09-02 LAB — T PALLIDUM AB: T Pallidum Abs: POSITIVE — AB

## 2023-09-02 LAB — RPR: RPR Ser Ql: REACTIVE — AB

## 2023-09-02 LAB — HEMOGLOBIN A1C: Hgb A1c MFr Bld: <4.2 %{Hb} (ref <5.7)

## 2023-09-02 LAB — RPR TITER: RPR Titer: 1:1 {titer} — ABNORMAL HIGH

## 2023-09-02 LAB — ESTRADIOL: Estradiol: 24 pg/mL (ref <=39)

## 2023-09-02 NOTE — Progress Notes (Signed)
We are still waiting on a couple of test to come back however his labs so far shows that he has developed anemia since the last time that we checked.  This is probably explaining some of his symptoms.  I would like for him to come back for repeat labs if possible - please place order for CBC, LDH, haptoglobin, folate, methylmalonic acid, B12, thiamine, and peripheral smear.  Did have slightly low sodium however this is near his previous baseline.  Please place future order for CMET so that we can recheck this with above.  The rest of his labs are all stable.  His RPR titer is improving showing adequate response to treatment for his syphilis.  He has no signs of a UTI.  His testosterone and thyroid levels are all normal.

## 2023-09-03 ENCOUNTER — Other Ambulatory Visit: Payer: Self-pay | Admitting: *Deleted

## 2023-09-03 ENCOUNTER — Telehealth: Payer: Self-pay | Admitting: *Deleted

## 2023-09-03 DIAGNOSIS — D649 Anemia, unspecified: Secondary | ICD-10-CM

## 2023-09-03 DIAGNOSIS — E871 Hypo-osmolality and hyponatremia: Secondary | ICD-10-CM

## 2023-09-03 MED ORDER — LISINOPRIL 30 MG PO TABS: 30.0000 mg | ORAL_TABLET | Freq: Two times a day (BID) | ORAL | 0 refills | Status: DC

## 2023-09-03 NOTE — Telephone Encounter (Signed)
Please advise  Rx Lisinopril refilled

## 2023-09-03 NOTE — Telephone Encounter (Signed)
Ok to send in tadalafil 10 mg daily. He should let us know if he experiences any side effects including headache or dizziness.   Katina Degree. Jimmey Ralph, MD 09/03/2023 9:11 AM

## 2023-09-03 NOTE — Telephone Encounter (Signed)
Please call patient to schedule a lab only appt  Thanks

## 2023-09-06 ENCOUNTER — Other Ambulatory Visit (INDEPENDENT_AMBULATORY_CARE_PROVIDER_SITE_OTHER): Payer: 59

## 2023-09-06 ENCOUNTER — Other Ambulatory Visit: Payer: Self-pay | Admitting: *Deleted

## 2023-09-06 DIAGNOSIS — D649 Anemia, unspecified: Secondary | ICD-10-CM | POA: Diagnosis not present

## 2023-09-06 DIAGNOSIS — E871 Hypo-osmolality and hyponatremia: Secondary | ICD-10-CM

## 2023-09-06 LAB — COMPREHENSIVE METABOLIC PANEL
ALT: 17 U/L (ref 0–53)
AST: 22 U/L (ref 0–37)
Albumin: 4.7 g/dL (ref 3.5–5.2)
Alkaline Phosphatase: 29 U/L — ABNORMAL LOW (ref 39–117)
BUN: 12 mg/dL (ref 6–23)
CO2: 25 meq/L (ref 19–32)
Calcium: 9 mg/dL (ref 8.4–10.5)
Chloride: 104 meq/L (ref 96–112)
Creatinine, Ser: 0.77 mg/dL (ref 0.40–1.50)
GFR: 108.92 mL/min (ref >60.00)
Glucose, Bld: 88 mg/dL (ref 70–99)
Potassium: 4.3 meq/L (ref 3.5–5.1)
Sodium: 136 meq/L (ref 135–145)
Total Bilirubin: 0.9 mg/dL (ref 0.2–1.2)
Total Protein: 6.6 g/dL (ref 6.0–8.3)

## 2023-09-06 LAB — FOLATE: Folate: >24.2 ng/mL (ref >5.9)

## 2023-09-06 LAB — VITAMIN B12: Vitamin B-12: 667 pg/mL (ref 211–911)

## 2023-09-06 MED ORDER — TADALAFIL 10 MG PO TABS: 10.0000 mg | ORAL_TABLET | ORAL | 1 refills | Status: DC | PRN

## 2023-09-06 NOTE — Addendum Note (Signed)
Addended by: Lorn Junes on: 09/06/2023 09:51 AM   Modules accepted: Orders

## 2023-09-09 NOTE — Progress Notes (Signed)
Can we check with the lab to see what happened with his A1c?  His estradiol level is in the normal range.   Katina Degree. Jimmey Ralph, MD 09/09/2023 7:50 AM

## 2023-09-09 NOTE — Progress Notes (Signed)
His A1c resulted with his other labs at <4.2.  Devin Hendricks. Devin Ralph, MD 09/09/2023 3:13 PM

## 2023-09-10 ENCOUNTER — Encounter: Payer: Self-pay | Admitting: Family Medicine

## 2023-09-11 LAB — CBC
HCT: 34.4 % — ABNORMAL LOW (ref 38.5–50.0)
Hemoglobin: 11.6 g/dL — ABNORMAL LOW (ref 13.2–17.1)
MCH: 36.1 pg — ABNORMAL HIGH (ref 27.0–33.0)
MCHC: 33.7 g/dL (ref 32.0–36.0)
MCV: 107.2 fL — ABNORMAL HIGH (ref 80.0–100.0)
MPV: 8.5 fL (ref 7.5–12.5)
Platelets: 217 10*3/uL (ref 140–400)
RBC: 3.21 10*6/uL — ABNORMAL LOW (ref 4.20–5.80)
RDW: 11.3 % (ref 11.0–15.0)
WBC: 3.7 10*3/uL — ABNORMAL LOW (ref 3.8–10.8)

## 2023-09-11 LAB — VITAMIN B1: Vitamin B1 (Thiamine): 15 nmol/L (ref 8–30)

## 2023-09-11 LAB — HAPTOGLOBIN: Haptoglobin: <10 mg/dL — ABNORMAL LOW (ref 43–212)

## 2023-09-11 LAB — PATHOLOGIST SMEAR REVIEW

## 2023-09-11 LAB — METHYLMALONIC ACID, SERUM: Methylmalonic Acid, Quant: 128 nmol/L (ref 55–335)

## 2023-09-13 NOTE — Progress Notes (Signed)
We are still waiting on one of his blood test to come back however his blood test is showing that he is anemic likely due to breakdown of his red blood cells.  He does not have any obvious vitamin deficiencies that would explain this.  This can sometimes be seen with nitrite use. We have not discussed this but recommend he not use any nitrite containing products if he is on this..  Recommend referral to hematology for further evaluation.  Please have him come in to for an appointment if he has questions but it is important we have him see hematology soon.  If he is having any worsening symptoms he needs to be seen ASAP.

## 2023-10-12 ENCOUNTER — Other Ambulatory Visit (HOSPITAL_COMMUNITY): Payer: Self-pay

## 2023-10-18 ENCOUNTER — Other Ambulatory Visit: Payer: Self-pay | Admitting: *Deleted

## 2023-10-18 MED ORDER — LISINOPRIL 30 MG PO TABS: 30.0000 mg | ORAL_TABLET | Freq: Two times a day (BID) | ORAL | 0 refills | Status: DC

## 2023-11-22 ENCOUNTER — Other Ambulatory Visit: Payer: Self-pay | Admitting: Family Medicine

## 2023-12-19 ENCOUNTER — Other Ambulatory Visit: Payer: Self-pay | Admitting: Family Medicine

## 2024-01-14 ENCOUNTER — Other Ambulatory Visit: Payer: Self-pay | Admitting: Family Medicine

## 2024-02-13 ENCOUNTER — Other Ambulatory Visit: Payer: Self-pay | Admitting: Family Medicine

## 2024-02-14 ENCOUNTER — Encounter: Payer: Self-pay | Admitting: Family Medicine

## 2024-02-14 ENCOUNTER — Ambulatory Visit (INDEPENDENT_AMBULATORY_CARE_PROVIDER_SITE_OTHER): Admitting: Family Medicine

## 2024-02-14 VITALS — BP 102/68 | HR 79 | Temp 79.0°F | Ht 70.0 in | Wt 160.4 lb

## 2024-02-14 DIAGNOSIS — Z131 Encounter for screening for diabetes mellitus: Secondary | ICD-10-CM

## 2024-02-14 DIAGNOSIS — N400 Enlarged prostate without lower urinary tract symptoms: Secondary | ICD-10-CM

## 2024-02-14 DIAGNOSIS — I10 Essential (primary) hypertension: Secondary | ICD-10-CM | POA: Diagnosis not present

## 2024-02-14 DIAGNOSIS — Z1159 Encounter for screening for other viral diseases: Secondary | ICD-10-CM | POA: Diagnosis not present

## 2024-02-14 DIAGNOSIS — Z7252 High risk homosexual behavior: Secondary | ICD-10-CM | POA: Diagnosis not present

## 2024-02-14 DIAGNOSIS — Z0001 Encounter for general adult medical examination with abnormal findings: Secondary | ICD-10-CM

## 2024-02-14 DIAGNOSIS — R931 Abnormal findings on diagnostic imaging of heart and coronary circulation: Secondary | ICD-10-CM

## 2024-02-14 DIAGNOSIS — A630 Anogenital (venereal) warts: Secondary | ICD-10-CM | POA: Diagnosis not present

## 2024-02-14 DIAGNOSIS — I2510 Atherosclerotic heart disease of native coronary artery without angina pectoris: Secondary | ICD-10-CM

## 2024-02-14 DIAGNOSIS — I471 Supraventricular tachycardia, unspecified: Secondary | ICD-10-CM | POA: Diagnosis not present

## 2024-02-14 DIAGNOSIS — F338 Other recurrent depressive disorders: Secondary | ICD-10-CM | POA: Diagnosis not present

## 2024-02-14 LAB — CBC
HCT: 42 % (ref 39.0–52.0)
Hemoglobin: 14.8 g/dL (ref 13.0–17.0)
MCHC: 35.3 g/dL (ref 30.0–36.0)
MCV: 90.6 fl (ref 78.0–100.0)
Platelets: 206 10*3/uL (ref 150.0–400.0)
RBC: 4.63 Mil/uL (ref 4.22–5.81)
RDW: 12.7 % (ref 11.5–15.5)
WBC: 3.7 10*3/uL — ABNORMAL LOW (ref 4.0–10.5)

## 2024-02-14 LAB — LIPID PANEL
Cholesterol: 102 mg/dL (ref 0–200)
HDL: 49.5 mg/dL (ref >39.00)
LDL Cholesterol: 43 mg/dL (ref 0–99)
NonHDL: 52.98
Total CHOL/HDL Ratio: 2
Triglycerides: 49 mg/dL (ref 0.0–149.0)
VLDL: 9.8 mg/dL (ref 0.0–40.0)

## 2024-02-14 LAB — COMPREHENSIVE METABOLIC PANEL WITH GFR
ALT: 14 U/L (ref 0–53)
AST: 20 U/L (ref 0–37)
Albumin: 4.7 g/dL (ref 3.5–5.2)
Alkaline Phosphatase: 36 U/L — ABNORMAL LOW (ref 39–117)
BUN: 14 mg/dL (ref 6–23)
CO2: 29 meq/L (ref 19–32)
Calcium: 9.3 mg/dL (ref 8.4–10.5)
Chloride: 101 meq/L (ref 96–112)
Creatinine, Ser: 0.81 mg/dL (ref 0.40–1.50)
GFR: 106.94 mL/min (ref >60.00)
Glucose, Bld: 89 mg/dL (ref 70–99)
Potassium: 4.1 meq/L (ref 3.5–5.1)
Sodium: 136 meq/L (ref 135–145)
Total Bilirubin: 0.6 mg/dL (ref 0.2–1.2)
Total Protein: 7.2 g/dL (ref 6.0–8.3)

## 2024-02-14 LAB — HEMOGLOBIN A1C: Hgb A1c MFr Bld: 4.6 % (ref 4.6–6.5)

## 2024-02-14 LAB — MAGNESIUM: Magnesium: 2 mg/dL (ref 1.5–2.5)

## 2024-02-14 MED ORDER — LISINOPRIL 30 MG PO TABS: 30.0000 mg | ORAL_TABLET | Freq: Every day | ORAL | 3 refills | Status: AC

## 2024-02-14 MED ORDER — BUPROPION HCL ER (XL) 300 MG PO TB24: 300.0000 mg | ORAL_TABLET | Freq: Every day | ORAL | 3 refills | Status: AC

## 2024-02-14 MED ORDER — TRAZODONE HCL 150 MG PO TABS: 300.0000 mg | ORAL_TABLET | Freq: Every day | ORAL | 3 refills | Status: AC

## 2024-02-14 MED ORDER — TADALAFIL 10 MG PO TABS: 10.0000 mg | ORAL_TABLET | ORAL | 5 refills | Status: DC | PRN

## 2024-02-14 MED ORDER — EMTRICITABINE-TENOFOVIR DF 200-300 MG PO TABS: 1.0000 | ORAL_TABLET | Freq: Every day | ORAL | 3 refills | Status: AC

## 2024-02-14 MED ORDER — DILTIAZEM HCL 60 MG PO TABS: 60.0000 mg | ORAL_TABLET | Freq: Every day | ORAL | 5 refills | Status: DC | PRN

## 2024-02-14 MED ORDER — DILTIAZEM HCL ER COATED BEADS 300 MG PO CP24: 300.0000 mg | ORAL_CAPSULE | Freq: Every day | ORAL | 3 refills | Status: AC

## 2024-02-14 MED ORDER — ATORVASTATIN CALCIUM 20 MG PO TABS: 20.0000 mg | ORAL_TABLET | Freq: Every day | ORAL | 3 refills | Status: AC

## 2024-02-14 NOTE — Assessment & Plan Note (Signed)
 Stable on Wellbutrin  300 mg daily and trazodone  300 mg nightly.  Will refill today.

## 2024-02-14 NOTE — Assessment & Plan Note (Signed)
 We are increasing his diltiazem  as above to help with his palpitations and PSVT.  We did discuss decreasing lisinopril  to 20 mg daily however he would like to continue with 30 mg daily.  He will monitor his blood pressure at home.  We did discuss signs and symptoms of low blood pressure at home including lightheadedness and dizziness.  He will follow-up with us  in a couple weeks via MyChart as above.

## 2024-02-14 NOTE — Assessment & Plan Note (Signed)
 On PrEP with Truvada.  Tolerating well.  Will refill today and check labs.

## 2024-02-14 NOTE — Progress Notes (Signed)
 Chief Complaint:  Devin Hendricks is a 43 y.o. male who presents today for his annual comprehensive physical exam.    Assessment/Plan:  New/Acute Problems: Condyloma acuminata No red flags.  Cryotherapy applied today.  See below procedure note.  He tolerated well.  Chronic Problems Addressed Today: No problem-specific Assessment & Plan notes found for this encounter.  Preventative Healthcare: Check labs.  Up-to-date on vaccines.  Patient Counseling(The following topics were reviewed and/or handout was given):  -Nutrition: Stressed importance of moderation in sodium/caffeine intake, saturated fat and cholesterol, caloric balance, sufficient intake of fresh fruits, vegetables, and fiber.  -Stressed the importance of regular exercise.   -Substance Abuse: Discussed cessation/primary prevention of tobacco, alcohol, or other drug use; driving or other dangerous activities under the influence; availability of treatment for abuse.   -Injury prevention: Discussed safety belts, safety helmets, smoke detector, smoking near bedding or upholstery.   -Sexuality: Discussed sexually transmitted diseases, partner selection, use of condoms, avoidance of unintended pregnancy and contraceptive alternatives.   -Dental health: Discussed importance of regular tooth brushing, flossing, and dental visits.  -Health maintenance and immunizations reviewed. Please refer to Health maintenance section.  Return to care in 1 year for next preventative visit.     Subjective:  HPI:  He has no acute complaints today. See Assessment / plan for status of chronic conditions.  Overall he feels well today.  He has noticed increasing palpitations last several weeks that have felt like his SVT.  He is interested in increasing dose of his diltiazem .  He is tolerating his current medications well without any significant side effects.  Needs refill on all of his medications today.  HE has also noticed a lesion on the dorsal  aspect of his penis.  Started about 9 months ago.  Staying about the same size.  No pain.  No drainage.  Lifestyle Diet: Plenty of fruits and vegetables.  Exercise: Working out multiple times per week.      02/14/2024   10:19 AM  Depression screen PHQ 2/9  Decreased Interest 0  Down, Depressed, Hopeless 0  PHQ - 2 Score 0    Health Maintenance Due  Topic Date Due   Hepatitis C Screening  Never done     ROS: Per HPI, otherwise a complete review of systems was negative.   PMH:  The following were reviewed and entered/updated in epic: Past Medical History:  Diagnosis Date   Anxiety    Depression    Erectile dysfunction    Hyperlipidemia    Hypertension    Patient Active Problem List   Diagnosis Date Noted   BPH (benign prostatic hyperplasia) 11/27/2022   Seasonal affective disorder (HCC) 11/27/2022   High risk sexual behavior 10/08/2022   Early syphilis, genital (primary) 03/17/2022   Coronary artery calcification 05/01/2021   PSVT (paroxysmal supraventricular tachycardia) (HCC) 05/01/2021   Essential hypertension 05/01/2021   Family history of early CAD 03/17/2021   Past Surgical History:  Procedure Laterality Date   BLEPHAROPLASTY      Family History  Problem Relation Age of Onset   Cancer Mother 19       lymphoma   Hypertension Mother    Hyperlipidemia Mother    Heart disease Mother    Early death Mother    Depression Mother    Diabetes Mother    Heart attack Mother     Medications- reviewed and updated Current Outpatient Medications  Medication Sig Dispense Refill   aspirin  EC 81 MG tablet Take  1 tablet (81 mg total) by mouth daily. Swallow whole. 90 tablet 3   atorvastatin  (LIPITOR) 20 MG tablet Take 1 tablet by mouth once daily 30 tablet 0   buPROPion  (WELLBUTRIN  XL) 300 MG 24 hr tablet Take 1 tablet (300 mg total) by mouth daily. 90 tablet 3   diltiazem  (CARDIZEM  CD) 240 MG 24 hr capsule Take 1 capsule by mouth once daily 90 capsule 0    emtricitabine -tenofovir  (TRUVADA) 200-300 MG tablet Take 1 tablet by mouth daily. 90 tablet 1   lisinopril  (ZESTRIL ) 30 MG tablet Take 1 tablet by mouth twice daily 90 tablet 0   tadalafil  (CIALIS ) 10 MG tablet Take 1 tablet (10 mg total) by mouth every other day as needed for erectile dysfunction. 10 tablet 1   traZODone  (DESYREL ) 150 MG tablet Take 2 tablets (300 mg total) by mouth at bedtime. 180 tablet 3   No current facility-administered medications for this visit.    Allergies-reviewed and updated No Known Allergies  Social History   Socioeconomic History   Marital status: Divorced    Spouse name: Not on file   Number of children: 2   Years of education: Not on file   Highest education level: Some college, no degree  Occupational History   Not on file  Tobacco Use   Smoking status: Former    Current packs/day: 0.00    Average packs/day: 1 pack/day for 20.0 years (20.0 ttl pk-yrs)    Types: Cigarettes    Start date: 2000    Quit date: 2020    Years since quitting: 5.3   Smokeless tobacco: Never  Vaping Use   Vaping status: Never Used  Substance and Sexual Activity   Alcohol use: Not Currently   Drug use: Never   Sexual activity: Yes  Other Topics Concern   Not on file  Social History Narrative   Children 1 adopted 1 natural      Self-media production   Social Drivers of Health   Financial Resource Strain: Low Risk  (02/13/2024)   Overall Financial Resource Strain (CARDIA)    Difficulty of Paying Living Expenses: Not hard at all  Food Insecurity: No Food Insecurity (02/13/2024)   Hunger Vital Sign    Worried About Running Out of Food in the Last Year: Never true    Ran Out of Food in the Last Year: Never true  Transportation Needs: No Transportation Needs (02/13/2024)   PRAPARE - Administrator, Civil Service (Medical): No    Lack of Transportation (Non-Medical): No  Physical Activity: Sufficiently Active (02/13/2024)   Exercise Vital Sign    Days  of Exercise per Week: 5 days    Minutes of Exercise per Session: 30 min  Stress: No Stress Concern Present (02/13/2024)   [employer] of Occupational Health - Occupational Stress Questionnaire    Feeling of Stress : Only a little  Social Connections: Socially Isolated (02/13/2024)   Social Connection and Isolation Panel [NHANES]    Frequency of Communication with Friends and Family: Once a week    Frequency of Social Gatherings with Friends and Family: Once a week    Attends Religious Services: 1 to 4 times per year    Active Member of [employer] or Organizations: No    Attends Engineer, structural: Not on file    Marital Status: Divorced        Objective:  Physical Exam: BP 102/68   Pulse 79   Temp (!) 79 F (26.1  C) (Temporal)   Ht 5\' 10"  (1.778 m)   Wt 160 lb 6.4 oz (72.8 kg)   SpO2 98%   BMI 23.02 kg/m   Body mass index is 23.02 kg/m. Wt Readings from Last 3 Encounters:  02/14/24 160 lb 6.4 oz (72.8 kg)  08/31/23 167 lb 9.6 oz (76 kg)  11/27/22 168 lb (76.2 kg)   Gen: NAD, resting comfortably HEENT: TMs normal bilaterally. OP clear. No thyromegaly noted.  CV: RRR with no murmurs appreciated Pulm: NWOB, CTAB with no crackles, wheezes, or rhonchi GI: Normal bowel sounds present. Soft, Nontender, Nondistended. MSK: no edema, cyanosis, or clubbing noted Skin: warm, dry GU: Small approximately 3 mm hyperkeratotic lesion on dorsal aspect of penis near base.  Otherwise normal male genitalia. Neuro: CN2-12 grossly intact. Strength 5/5 in upper and lower extremities. Reflexes symmetric and intact bilaterally.  Psych: Normal affect and thought content  Cryotherapy Procedure Note  Pre-operative Diagnosis: Condyloma acuminata  Locations: penis  Indications: Therapeutic  Procedure Details  Patient informed of risks (permanent scarring, infection, light or dark discoloration, bleeding, infection, weakness, numbness and recurrence of the lesion) and benefits of the  procedure and verbal informed consent obtained.  The areas are treated with liquid nitrogen therapy, frozen until ice ball extended 3 mm beyond lesion, allowed to thaw, and treated again. The patient tolerated procedure well.  The patient was instructed on post-op care, warned that there may be blister formation, redness and pain. Recommend OTC analgesia as needed for pain.  Condition: Stable  Complications: none.       Jinny Mounts. Daneil Dunker, MD 02/14/2024 10:24 AM

## 2024-02-14 NOTE — Assessment & Plan Note (Signed)
 On Cialis  5 mg daily and tolerating well.

## 2024-02-14 NOTE — Assessment & Plan Note (Signed)
 Has family history of early CAD.  He is on aspirin  81 mg daily and Lipitor 20 mg daily.  Will check lipids today and apolipoprotein B per patient request.  We discussed lifestyle interventions as well.

## 2024-02-14 NOTE — Assessment & Plan Note (Signed)
 Has had mild flareup of palpitations over the last few weeks.  He has been monitoring his blood pressure at home which is typically been well-controlled.  We will increase his diltiazem  to 300 mg daily and also send in an extra 60 mg immediate release to use as needed for days that he has increasing palpitations.  Will check labs today as well.  Overall reassuring exam today.  He will follow-up with us  in a few weeks.  If still having ongoing issues with this will need to follow-up with cardiology.  We discussed reasons to return to care.

## 2024-02-14 NOTE — Patient Instructions (Signed)
 It was very nice to see you today!  We will increase your dose of diltiazem .  Please send me a message in a few weeks to let me know how this is working for you  We froze the wart today.  Let us  know if not improving in a couple of weeks.  Return in about 1 year (around 02/13/2025), or if symptoms worsen or fail to improve, for Annual Physical.   Take care, Dr Daneil Dunker  PLEASE NOTE:  If you had any lab tests, please let us  know if you have not heard back within a few days. You may see your results on mychart before we have a chance to review them but we will give you a call once they are reviewed by us .   If we ordered any referrals today, please let us  know if you have not heard from their office within the next week.   If you had any urgent prescriptions sent in today, please check with the pharmacy within an hour of our visit to make sure the prescription was transmitted appropriately.   Please try these tips to maintain a healthy lifestyle:  Eat at least 3 REAL meals and 1-2 snacks per day.  Aim for no more than 5 hours between eating.  If you eat breakfast, please do so within one hour of getting up.   Each meal should contain half fruits/vegetables, one quarter protein, and one quarter carbs (no bigger than a computer mouse)  Cut down on sweet beverages. This includes juice, soda, and sweet tea.   Drink at least 1 glass of water with each meal and aim for at least 8 glasses per day  Exercise at least 150 minutes every week.     Preventive Care 53-12 Years Old, Male Preventive care refers to lifestyle choices and visits with your health care provider that can promote health and wellness. Preventive care visits are also called wellness exams. What can I expect for my preventive care visit? Counseling During your preventive care visit, your health care provider may ask about your: Medical history, including: Past medical problems. Family medical history. Current health,  including: Emotional well-being. Home life and relationship well-being. Sexual activity. Lifestyle, including: Alcohol, nicotine or tobacco, and drug use. Access to firearms. Diet, exercise, and sleep habits. Safety issues such as seatbelt and bike helmet use. Sunscreen use. Work and work Astronomer. Physical exam Your health care provider will check your: Height and weight. These may be used to calculate your BMI (body mass index). BMI is a measurement that tells if you are at a healthy weight. Waist circumference. This measures the distance around your waistline. This measurement also tells if you are at a healthy weight and may help predict your risk of certain diseases, such as type 2 diabetes and high blood pressure. Heart rate and blood pressure. Body temperature. Skin for abnormal spots. What immunizations do I need?  Vaccines are usually given at various ages, according to a schedule. Your health care provider will recommend vaccines for you based on your age, medical history, and lifestyle or other factors, such as travel or where you work. What tests do I need? Screening Your health care provider may recommend screening tests for certain conditions. This may include: Lipid and cholesterol levels. Diabetes screening. This is done by checking your blood sugar (glucose) after you have not eaten for a while (fasting). Hepatitis B test. Hepatitis C test. HIV (human immunodeficiency virus) test. STI (sexually transmitted infection) testing, if you  are at risk. Lung cancer screening. Prostate cancer screening. Colorectal cancer screening. Talk with your health care provider about your test results, treatment options, and if necessary, the need for more tests. Follow these instructions at home: Eating and drinking  Eat a diet that includes fresh fruits and vegetables, whole grains, lean protein, and low-fat dairy products. Take vitamin and mineral supplements as recommended  by your health care provider. Do not drink alcohol if your health care provider tells you not to drink. If you drink alcohol: Limit how much you have to 0-2 drinks a day. Know how much alcohol is in your drink. In the [place] one drink equals one 12 oz bottle of beer (355 mL), one 5 oz glass of wine (148 mL), or one 1 oz glass of hard liquor (44 mL). Lifestyle Brush your teeth every morning and night with fluoride toothpaste. Floss one time each day. Exercise for at least 30 minutes 5 or more days each week. Do not use any products that contain nicotine or tobacco. These products include cigarettes, chewing tobacco, and vaping devices, such as e-cigarettes. If you need help quitting, ask your health care provider. Do not use drugs. If you are sexually active, practice safe sex. Use a condom or other form of protection to prevent STIs. Take aspirin  only as told by your health care provider. Make sure that you understand how much to take and what form to take. Work with your health care provider to find out whether it is safe and beneficial for you to take aspirin  daily. Find healthy ways to manage stress, such as: Meditation, yoga, or listening to music. Journaling. Talking to a trusted person. Spending time with friends and family. Minimize exposure to UV radiation to reduce your risk of skin cancer. Safety Always wear your seat belt while driving or riding in a vehicle. Do not drive: If you have been drinking alcohol. Do not ride with someone who has been drinking. When you are tired or distracted. While texting. If you have been using any mind-altering substances or drugs. Wear a helmet and other protective equipment during sports activities. If you have firearms in your house, make sure you follow all gun safety procedures. What's next? Go to your health care provider once a year for an annual wellness visit. Ask your health care provider how often you should have your eyes and teeth  checked. Stay up to date on all vaccines. This information is not intended to replace advice given to you by your health care provider. Make sure you discuss any questions you have with your health care provider. Document Revised: 03/12/2021 Document Reviewed: 03/12/2021 Elsevier Patient Education  2024 [employer].

## 2024-02-15 LAB — HIV ANTIBODY (ROUTINE TESTING W REFLEX): HIV 1&2 Ab, 4th Generation: NONREACTIVE

## 2024-02-15 LAB — TSH: TSH: 1.68 u[IU]/mL (ref 0.35–5.50)

## 2024-02-15 LAB — APOLIPOPROTEIN B: Apolipoprotein B: 43 mg/dL (ref <90)

## 2024-02-15 LAB — HEPATITIS C ANTIBODY: Hepatitis C Ab: NONREACTIVE

## 2024-02-16 ENCOUNTER — Ambulatory Visit: Payer: Self-pay | Admitting: Family Medicine

## 2024-02-16 NOTE — Progress Notes (Signed)
 His labs are all stable.  Do not need to make any changes to the treatment plan at this time.  We can recheck his cholesterol and A1c in a year though he should come back in 3 months to check his labs for Truvada.

## 2024-07-03 ENCOUNTER — Ambulatory Visit: Admitting: Family Medicine

## 2024-07-03 ENCOUNTER — Encounter: Payer: Self-pay | Admitting: Family Medicine

## 2024-07-03 VITALS — BP 98/62 | HR 74 | Temp 98.0°F | Ht 70.0 in | Wt 172.4 lb

## 2024-07-03 DIAGNOSIS — M62838 Other muscle spasm: Secondary | ICD-10-CM | POA: Diagnosis not present

## 2024-07-03 DIAGNOSIS — Z7252 High risk homosexual behavior: Secondary | ICD-10-CM

## 2024-07-03 DIAGNOSIS — N529 Male erectile dysfunction, unspecified: Secondary | ICD-10-CM | POA: Diagnosis not present

## 2024-07-03 DIAGNOSIS — I10 Essential (primary) hypertension: Secondary | ICD-10-CM | POA: Diagnosis not present

## 2024-07-03 LAB — CBC
HCT: 40 % (ref 39.0–52.0)
Hemoglobin: 13.8 g/dL (ref 13.0–17.0)
MCHC: 34.5 g/dL (ref 30.0–36.0)
MCV: 93.2 fl (ref 78.0–100.0)
Platelets: 199 K/uL (ref 150.0–400.0)
RBC: 4.29 Mil/uL (ref 4.22–5.81)
RDW: 13.4 % (ref 11.5–15.5)
WBC: 4.4 K/uL (ref 4.0–10.5)

## 2024-07-03 LAB — SEDIMENTATION RATE: Sed Rate: 2 mm/h (ref 0–15)

## 2024-07-03 MED ORDER — TADALAFIL 10 MG PO TABS: 10.0000 mg | ORAL_TABLET | Freq: Every day | ORAL | 5 refills | Status: AC | PRN

## 2024-07-03 NOTE — Patient Instructions (Signed)
 It was very nice to see you today!  VISIT SUMMARY: During your visit, we discussed the pulsing and throbbing sensation in your neck, your cardiovascular health, and your current medications. We also addressed your request for a daily regimen of tadalafil  and reviewed your general health maintenance needs.  YOUR PLAN: NECK MUSCLE SPASM (STERNOCLEIDOMASTOID): The pulsing and throbbing sensation in your neck is likely due to a muscle spasm in the sternocleidomastoid muscle, with low suspicion for carotid artery involvement. -Continue taking aspirin  as it provides relief. -We discussed potential causes such as stress, dehydration, and sleeping position. -A neck ultrasound has been ordered. -You will receive a handout with neck range of motion exercises. -Ensure you stay hydrated, maintain a balanced diet, and get adequate sleep. -Consider Botox injections if symptoms persist.  CARDIAC ARRHYTHMIA (ON DILTIAZEM ): Your cardiac symptoms are well-managed with diltiazem , and no new symptoms have been reported. -Continue taking diltiazem  immediate release. -We have ordered HSCRP and sedimentation rate tests. -Follow up with your cardiologist if your symptoms change.  ERECTILE DYSFUNCTION (ON TADALAFIL ): Your erectile dysfunction is managed with tadalafil , and you prefer daily dosing. -We have prescribed tadalafil  10 mg daily, 30 tablets per month.  GENERAL HEALTH MAINTENANCE: Routine screenings and preventive measures were discussed. -We have ordered HIV and RPR tests for syphilis.  Return if symptoms worsen or fail to improve.   Take care, Dr Kennyth  PLEASE NOTE:  If you had any lab tests, please let us  know if you have not heard back within a few days. You may see your results on mychart before we have a chance to review them but we will give you a call once they are reviewed by us .   If we ordered any referrals today, please let us  know if you have not heard from their office within the  next week.   If you had any urgent prescriptions sent in today, please check with the pharmacy within an hour of our visit to make sure the prescription was transmitted appropriately.   Please try these tips to maintain a healthy lifestyle:  Eat at least 3 REAL meals and 1-2 snacks per day.  Aim for no more than 5 hours between eating.  If you eat breakfast, please do so within one hour of getting up.   Each meal should contain half fruits/vegetables, one quarter protein, and one quarter carbs (no bigger than a computer mouse)  Cut down on sweet beverages. This includes juice, soda, and sweet tea.   Drink at least 1 glass of water with each meal and aim for at least 8 glasses per day  Exercise at least 150 minutes every week.

## 2024-07-03 NOTE — Assessment & Plan Note (Signed)
 He is currently on diltiazem  300 mg daily and lisinopril  30 mg daily.  Blood pressure is at goal today.

## 2024-07-03 NOTE — Assessment & Plan Note (Signed)
 On PrEP with Truvada.  Check labs.  Does not need refill today.

## 2024-07-03 NOTE — Progress Notes (Signed)
   Devin Hendricks is a 43 y.o. male who presents today for an office visit.  Assessment/Plan:  New/Acute Problems: Neck Spasm No red flags.  Video on patient's phone and exam consistent with muscular spasm in SCM muscle group.  Low suspicion for carotid artery disease.  No abnormalities on exam and he did have a [employer] screening a few months ago which was normal.  Discussed repeating ultrasound however do not think this is necessary at this point.  He has no overt signs for torticollis however we did discuss range of motion exercises.  Also discussed importance of good hydration.  He will let us  know if not improving in the next 1-2 weeks and would consider referral to physical therapy or sports medicine at that time.  Chronic Problems Addressed Today: High risk sexual behavior On PrEP with Truvada.  Check labs.  Does not need refill today.  Essential hypertension He is currently on diltiazem  300 mg daily and lisinopril  30 mg daily.  Blood pressure is at goal today.  Erectile dysfunction Stable on tadalafil  10 mg daily as needed.  Will refill today.     Subjective:  HPI:  See assessment / plan for status of chronic conditions.   Discussed the use of AI scribe software for clinical note transcription with the patient, who gave verbal consent to proceed.  History of Present Illness Devin Hendricks is a 43 year old male with cardiovascular issues who presents with neck pulsing and throbbing.  He has been experiencing a pulsing and throbbing sensation in his neck, specifically in the area of the carotid artery, for the past few weeks. The sensation occurs throughout the day, is not painful, but can feel tender at times. He has not identified any specific triggers and does not associate it with any emergent condition.  He has a history of cardiovascular issues and is currently taking diltiazem  immediate release, which has been effective for his heart symptoms. He has also been taking  aspirin  over the past few days, which he reports seems to help.  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  He is currently on Truvada and is due for routine blood work, including an HIV test and RPR for syphilis, as part of his ongoing care. He wants to return to a daily regimen of tadalafil , as he is currently receiving only ten pills per month, which he finds insufficient for daily use.         Objective:  Physical Exam: BP 98/62   Pulse 74   Temp 98 F (36.7 C) (Temporal)   Ht 5' 10 (1.778 m)   Wt 172 lb 6.4 oz (78.2 kg)   SpO2 99%   BMI 24.74 kg/m   Gen: No acute distress, resting comfortably HEENT: No carotid bruits noted.  SCM nontender to palpation CV: Regular rate and rhythm with no murmurs appreciated Pulm: Normal work of breathing, clear to auscultation bilaterally with no crackles, wheezes, or rhonchi Neuro: Grossly normal, moves all extremities Psych: Normal affect and thought content      Devin Vane M. Kennyth, MD 07/03/2024 11:29 AM

## 2024-07-03 NOTE — Assessment & Plan Note (Signed)
 Stable on tadalafil 10 mg daily as needed.  Will refill today.

## 2024-07-04 LAB — COMPREHENSIVE METABOLIC PANEL WITH GFR
ALT: 12 U/L (ref 0–53)
AST: 16 U/L (ref 0–37)
Albumin: 4.5 g/dL (ref 3.5–5.2)
Alkaline Phosphatase: 36 U/L — ABNORMAL LOW (ref 39–117)
BUN: 11 mg/dL (ref 6–23)
CO2: 27 meq/L (ref 19–32)
Calcium: 9.1 mg/dL (ref 8.4–10.5)
Chloride: 104 meq/L (ref 96–112)
Creatinine, Ser: 0.76 mg/dL (ref 0.40–1.50)
GFR: 108.72 mL/min (ref >60.00)
Glucose, Bld: 90 mg/dL (ref 70–99)
Potassium: 4.1 meq/L (ref 3.5–5.1)
Sodium: 138 meq/L (ref 135–145)
Total Bilirubin: 0.3 mg/dL (ref 0.2–1.2)
Total Protein: 6.6 g/dL (ref 6.0–8.3)

## 2024-07-04 LAB — HIGH SENSITIVITY CRP: CRP, High Sensitivity: 0.63 mg/L (ref 0.000–5.000)

## 2024-07-06 ENCOUNTER — Ambulatory Visit: Payer: Self-pay | Admitting: Family Medicine

## 2024-07-06 DIAGNOSIS — A510 Primary genital syphilis: Secondary | ICD-10-CM

## 2024-07-06 LAB — T PALLIDUM AB: T Pallidum Abs: POSITIVE — AB

## 2024-07-06 LAB — HIV ANTIBODY (ROUTINE TESTING W REFLEX)
HIV 1&2 Ab, 4th Generation: NONREACTIVE
HIV FINAL INTERPRETATION: NEGATIVE

## 2024-07-06 LAB — RPR TITER: RPR Titer: 1:2 {titer} — ABNORMAL HIGH

## 2024-07-06 LAB — RPR: RPR Ser Ql: REACTIVE — AB

## 2024-07-06 NOTE — Progress Notes (Signed)
 His syphilis test increased slightly.  It is possible that this represents a new infection however it is not clear. We can send him to infectious disease clinic for further evaluation if he wishes or we can recheck again in 3 months.    All of his other labs are at goal and we can recheck in a year.

## 2024-07-30 ENCOUNTER — Other Ambulatory Visit: Payer: Self-pay | Admitting: Family Medicine

## 2024-08-25 ENCOUNTER — Other Ambulatory Visit: Payer: Self-pay | Admitting: Family Medicine

## 2024-09-22 ENCOUNTER — Other Ambulatory Visit: Payer: Self-pay | Admitting: Family Medicine

## 2024-10-21 ENCOUNTER — Other Ambulatory Visit: Payer: Self-pay | Admitting: Family Medicine
